# Patient Record
Sex: Female | Born: 1975
Health system: Southern US, Community
[De-identification: ages and names within clinical notes are randomized; demographics above are authoritative.]

## PROBLEM LIST (undated history)

## (undated) DIAGNOSIS — Z9889 Other specified postprocedural states: Secondary | ICD-10-CM

## (undated) DIAGNOSIS — E669 Obesity, unspecified: Secondary | ICD-10-CM

## (undated) DIAGNOSIS — E063 Autoimmune thyroiditis: Secondary | ICD-10-CM

## (undated) DIAGNOSIS — R112 Nausea with vomiting, unspecified: Secondary | ICD-10-CM

## (undated) DIAGNOSIS — J302 Other seasonal allergic rhinitis: Secondary | ICD-10-CM

## (undated) DIAGNOSIS — E039 Hypothyroidism, unspecified: Secondary | ICD-10-CM

## (undated) DIAGNOSIS — I2699 Other pulmonary embolism without acute cor pulmonale: Secondary | ICD-10-CM

## (undated) DIAGNOSIS — D649 Anemia, unspecified: Secondary | ICD-10-CM

## (undated) DIAGNOSIS — K5900 Constipation, unspecified: Secondary | ICD-10-CM

## (undated) HISTORY — DX: Constipation, unspecified: K59.00

## (undated) HISTORY — DX: Autoimmune thyroiditis: E06.3

## (undated) HISTORY — DX: Anemia, unspecified: D64.9

## (undated) HISTORY — PX: VAGINA RECONSTRUCTION SURGERY: SHX828

## (undated) HISTORY — DX: Hypothyroidism, unspecified: E03.9

---

## 2001-09-05 ENCOUNTER — Other Ambulatory Visit: Admission: RE | Admit: 2001-09-05 | Discharge: 2001-09-05 | Payer: Self-pay | Admitting: Obstetrics and Gynecology

## 2002-03-20 ENCOUNTER — Inpatient Hospital Stay (HOSPITAL_COMMUNITY): Admission: RE | Admit: 2002-03-20 | Discharge: 2002-03-23 | Payer: Self-pay | Admitting: Obstetrics and Gynecology

## 2002-09-26 ENCOUNTER — Other Ambulatory Visit: Admission: RE | Admit: 2002-09-26 | Discharge: 2002-09-26 | Payer: Self-pay | Admitting: Obstetrics and Gynecology

## 2003-02-23 ENCOUNTER — Emergency Department (HOSPITAL_COMMUNITY): Admission: EM | Admit: 2003-02-23 | Discharge: 2003-02-23 | Payer: Self-pay | Admitting: Emergency Medicine

## 2003-02-23 ENCOUNTER — Encounter: Payer: Self-pay | Admitting: Emergency Medicine

## 2003-04-07 ENCOUNTER — Ambulatory Visit (HOSPITAL_COMMUNITY): Admission: RE | Admit: 2003-04-07 | Discharge: 2003-04-07 | Payer: Self-pay | Admitting: Gastroenterology

## 2003-10-05 ENCOUNTER — Other Ambulatory Visit: Admission: RE | Admit: 2003-10-05 | Discharge: 2003-10-05 | Payer: Self-pay | Admitting: Obstetrics and Gynecology

## 2003-10-16 ENCOUNTER — Ambulatory Visit (HOSPITAL_COMMUNITY): Admission: RE | Admit: 2003-10-16 | Discharge: 2003-10-16 | Payer: Self-pay | Admitting: Obstetrics and Gynecology

## 2004-10-05 ENCOUNTER — Other Ambulatory Visit: Admission: RE | Admit: 2004-10-05 | Discharge: 2004-10-05 | Payer: Self-pay | Admitting: Obstetrics and Gynecology

## 2005-07-19 ENCOUNTER — Emergency Department (HOSPITAL_COMMUNITY): Admission: EM | Admit: 2005-07-19 | Discharge: 2005-07-19 | Payer: Self-pay | Admitting: Emergency Medicine

## 2005-10-10 ENCOUNTER — Other Ambulatory Visit: Admission: RE | Admit: 2005-10-10 | Discharge: 2005-10-10 | Payer: Self-pay | Admitting: Obstetrics and Gynecology

## 2008-07-05 ENCOUNTER — Emergency Department (HOSPITAL_BASED_OUTPATIENT_CLINIC_OR_DEPARTMENT_OTHER): Admission: EM | Admit: 2008-07-05 | Discharge: 2008-07-05 | Payer: Self-pay | Admitting: Emergency Medicine

## 2009-02-15 ENCOUNTER — Ambulatory Visit: Payer: Self-pay | Admitting: Family Medicine

## 2009-02-15 DIAGNOSIS — J020 Streptococcal pharyngitis: Secondary | ICD-10-CM

## 2009-02-15 LAB — CONVERTED CEMR LAB: Rapid Strep: POSITIVE

## 2009-08-05 ENCOUNTER — Observation Stay (HOSPITAL_COMMUNITY): Admission: AD | Admit: 2009-08-05 | Discharge: 2009-08-06 | Payer: Self-pay | Admitting: Obstetrics and Gynecology

## 2010-03-03 ENCOUNTER — Inpatient Hospital Stay (HOSPITAL_COMMUNITY): Admission: RE | Admit: 2010-03-03 | Discharge: 2010-03-06 | Payer: Self-pay | Admitting: Obstetrics and Gynecology

## 2010-08-25 LAB — CBC
HCT: 28.6 % — ABNORMAL LOW (ref 36.0–46.0)
HCT: 35.2 % — ABNORMAL LOW (ref 36.0–46.0)
Hemoglobin: 11.7 g/dL — ABNORMAL LOW (ref 12.0–15.0)
Hemoglobin: 9.6 g/dL — ABNORMAL LOW (ref 12.0–15.0)
MCH: 28.1 pg (ref 26.0–34.0)
MCH: 28.2 pg (ref 26.0–34.0)
MCHC: 33.3 g/dL (ref 30.0–36.0)
MCHC: 33.6 g/dL (ref 30.0–36.0)
MCV: 83.9 fL (ref 78.0–100.0)
MCV: 84.2 fL (ref 78.0–100.0)
Platelets: 169 10*3/uL (ref 150–400)
Platelets: 238 10*3/uL (ref 150–400)
RBC: 3.42 MIL/uL — ABNORMAL LOW (ref 3.87–5.11)
RBC: 4.18 MIL/uL (ref 3.87–5.11)
RDW: 16.2 % — ABNORMAL HIGH (ref 11.5–15.5)
RDW: 17.3 % — ABNORMAL HIGH (ref 11.5–15.5)
WBC: 11.1 10*3/uL — ABNORMAL HIGH (ref 4.0–10.5)
WBC: 9.6 10*3/uL (ref 4.0–10.5)

## 2010-08-25 LAB — RPR: RPR Ser Ql: NONREACTIVE

## 2010-08-25 LAB — SURGICAL PCR SCREEN
MRSA, PCR: NEGATIVE
Staphylococcus aureus: POSITIVE — AB

## 2010-08-31 LAB — CBC
HCT: 35.4 % — ABNORMAL LOW (ref 36.0–46.0)
HCT: 39.2 % (ref 36.0–46.0)
Hemoglobin: 11.7 g/dL — ABNORMAL LOW (ref 12.0–15.0)
Hemoglobin: 13 g/dL (ref 12.0–15.0)
MCHC: 33 g/dL (ref 30.0–36.0)
MCHC: 33.2 g/dL (ref 30.0–36.0)
MCV: 85.8 fL (ref 78.0–100.0)
MCV: 86.4 fL (ref 78.0–100.0)
Platelets: 262 10*3/uL (ref 150–400)
Platelets: 276 10*3/uL (ref 150–400)
RBC: 4.1 MIL/uL (ref 3.87–5.11)
RBC: 4.57 MIL/uL (ref 3.87–5.11)
RDW: 14.1 % (ref 11.5–15.5)
RDW: 14.6 % (ref 11.5–15.5)
WBC: 10.1 10*3/uL (ref 4.0–10.5)
WBC: 9.6 10*3/uL (ref 4.0–10.5)

## 2010-08-31 LAB — HCG, QUANTITATIVE, PREGNANCY: hCG, Beta Chain, Quant, S: 96210 m[IU]/mL — ABNORMAL HIGH (ref <5)

## 2010-08-31 LAB — URINALYSIS, ROUTINE W REFLEX MICROSCOPIC
Glucose, UA: NEGATIVE mg/dL
Ketones, ur: NEGATIVE mg/dL
Leukocytes, UA: NEGATIVE
Nitrite: POSITIVE — AB
Protein, ur: 30 mg/dL — AB
Specific Gravity, Urine: >1.03 — ABNORMAL HIGH (ref 1.005–1.030)
Urobilinogen, UA: 0.2 mg/dL (ref 0.0–1.0)
pH: 5.5 (ref 5.0–8.0)

## 2010-08-31 LAB — URINE CULTURE
Colony Count: NO GROWTH
Culture: NO GROWTH

## 2010-08-31 LAB — DIFFERENTIAL
Basophils Absolute: 0 10*3/uL (ref 0.0–0.1)
Basophils Relative: 0 % (ref 0–1)
Eosinophils Absolute: 0.1 10*3/uL (ref 0.0–0.7)
Eosinophils Relative: 1 % (ref 0–5)
Lymphocytes Relative: 24 % (ref 12–46)
Lymphs Abs: 2.3 10*3/uL (ref 0.7–4.0)
Monocytes Absolute: 0.9 10*3/uL (ref 0.1–1.0)
Monocytes Relative: 10 % (ref 3–12)
Neutro Abs: 6.3 10*3/uL (ref 1.7–7.7)
Neutrophils Relative %: 66 % (ref 43–77)

## 2010-08-31 LAB — URINE MICROSCOPIC-ADD ON

## 2010-08-31 LAB — STONE ANALYSIS: Stone Weight KSTONE: 0.009 g

## 2010-09-26 LAB — COMPREHENSIVE METABOLIC PANEL
ALT: 13 U/L (ref 0–35)
AST: 24 U/L (ref 0–37)
Albumin: 4.5 g/dL (ref 3.5–5.2)
Alkaline Phosphatase: 114 U/L (ref 39–117)
BUN: 12 mg/dL (ref 6–23)
CO2: 20 mEq/L (ref 19–32)
Calcium: 9.5 mg/dL (ref 8.4–10.5)
Chloride: 109 mEq/L (ref 96–112)
Creatinine, Ser: 0.6 mg/dL (ref 0.4–1.2)
GFR calc Af Amer: >60 mL/min (ref >60)
GFR calc non Af Amer: >60 mL/min (ref >60)
Glucose, Bld: 124 mg/dL — ABNORMAL HIGH (ref 70–99)
Potassium: 4 mEq/L (ref 3.5–5.1)
Sodium: 141 mEq/L (ref 135–145)
Total Bilirubin: 1.1 mg/dL (ref 0.3–1.2)
Total Protein: 8.1 g/dL (ref 6.0–8.3)

## 2010-09-26 LAB — URINALYSIS, ROUTINE W REFLEX MICROSCOPIC
Bilirubin Urine: NEGATIVE
Glucose, UA: NEGATIVE mg/dL
Ketones, ur: 15 mg/dL — AB
Leukocytes, UA: NEGATIVE
Nitrite: NEGATIVE
Protein, ur: NEGATIVE mg/dL
Specific Gravity, Urine: 1.028 (ref 1.005–1.030)
Urobilinogen, UA: 0.2 mg/dL (ref 0.0–1.0)
pH: 5.5 (ref 5.0–8.0)

## 2010-09-26 LAB — DIFFERENTIAL
Basophils Absolute: 0.2 10*3/uL — ABNORMAL HIGH (ref 0.0–0.1)
Basophils Relative: 1 % (ref 0–1)
Eosinophils Absolute: 0 10*3/uL (ref 0.0–0.7)
Eosinophils Relative: 0 % (ref 0–5)
Lymphocytes Relative: 3 % — ABNORMAL LOW (ref 12–46)
Lymphs Abs: 0.5 10*3/uL — ABNORMAL LOW (ref 0.7–4.0)
Monocytes Absolute: 0.9 10*3/uL (ref 0.1–1.0)
Monocytes Relative: 6 % (ref 3–12)
Neutro Abs: 14.2 10*3/uL — ABNORMAL HIGH (ref 1.7–7.7)
Neutrophils Relative %: 90 % — ABNORMAL HIGH (ref 43–77)

## 2010-09-26 LAB — CBC
HCT: 45.4 % (ref 36.0–46.0)
Hemoglobin: 14.8 g/dL (ref 12.0–15.0)
MCHC: 32.7 g/dL (ref 30.0–36.0)
MCV: 87.8 fL (ref 78.0–100.0)
Platelets: 323 10*3/uL (ref 150–400)
RBC: 5.16 MIL/uL — ABNORMAL HIGH (ref 3.87–5.11)
RDW: 13.8 % (ref 11.5–15.5)
WBC: 15.8 10*3/uL — ABNORMAL HIGH (ref 4.0–10.5)

## 2010-09-26 LAB — URINE MICROSCOPIC-ADD ON

## 2010-09-26 LAB — PREGNANCY, URINE: Preg Test, Ur: NEGATIVE

## 2010-09-26 LAB — LIPASE, BLOOD: Lipase: 28 U/L (ref 23–300)

## 2010-10-28 NOTE — H&P (Signed)
Krystal Kim, Krystal Kim                           ACCOUNT NO.:  [id number]   MEDICAL RECORD NO.:  [id number]                   PATIENT TYPE:  INP   LOCATION:  NA                                   [facility]  [facility]   PHYSICIAN:  Dois Davenport A. Rivard, M.D.              DATE OF BIRTH:  [birth year]   DATE OF ADMISSION:  03/20/2002  DATE OF DISCHARGE:                                HISTORY & PHYSICAL   HISTORY OF PRESENT ILLNESS:  The patient is a 35 year old married white  female gravida 2, para 1-0-0-1 at [redacted] weeks gestation admitted for elective  primary cesarean section secondary to history of severe vaginal trauma with  her first delivery.  She denies any nausea, vomiting, headaches, or visual  disturbances.  She denies any vaginal bleeding.  Her pregnancy has been  followed at [facility] M.D. service and has been essentially  uncomplicated, though at risk for history of severe vaginal trauma with the  first delivery.   OB/GYN HISTORY:  She is a gravida 2, para 1-0-0-1 who has an LMP of June 22, 2001 giving an [facility] of March 29, 2002 confirmed by ultrasound.  She  delivered a viable female infant in January 2000 who weighed 7 pounds 15  ounces at [redacted] weeks gestation following a 12 hour labor.  She delivered  vaginally, but had severe vaginal shredding and tearing.  She had to have  surgery after delivery to repair her vaginal laceration.  She had a history  of abnormal Pap and colposcopy in 1997.  Subsequently, her Paps have been  normal.   ALLERGIES:  CODEINE.  It gives her vomiting.   PAST MEDICAL HISTORY:  She reports having had the usual childhood diseases.  She reports a history of childhood asthma, none now, history of migraines in  the past.   PAST SURGICAL HISTORY:  After surgery for vaginal repair subsequent to  delivery in 2000 and wisdom teeth in 1999.   ANESTHESIA COMPLICATIONS:  She had vomiting subsequent to her wisdom teeth  surgery.   FAMILY HISTORY:   Significant for paternal grandfather with heart attack and  heart disease, maternal grandfather and paternal grandfather with chronic  hypertension, father with varicose veins, mother with anemia, maternal  grandfather and aunt with diabetes, maternal grandfather, maternal  grandmother, and great maternal grandmother with cancer.   GENETIC HISTORY:  Essentially negative.   SOCIAL HISTORY:  She is married to [employer] who is involved and  supportive.  They are both employed full-time.  They have the [place]  faith.  They deny any illicit drug use, alcohol, or smoking with this  pregnancy.   PRENATAL LABORATORIES:  Her blood type is O+.  Her antibody screen is  negative.  Syphilis is nonreactive.  Rubella is immune.  Hepatitis B surface  antigen is negative.  GC and Chlamydia both negative.  Pap was within normal  limits.  Her one hour Glucola was 95 and her 36 week beta Strep was  negative.   PHYSICAL EXAMINATION:  VITAL SIGNS:  Stable.  She is afebrile.  HEENT:  Grossly within normal limits.  HEART:  Regular rhythm and rate.  CHEST:  Clear.  BREASTS:  Soft and nontender.  ABDOMEN:  Gravid without regular uterine contractions.  Her fetal heart rate  is reassuring.  PELVIC:  Her pelvic examination was deferred.  EXTREMITIES:  Within normal limits.   ASSESSMENT:  1. Intrauterine pregnancy at term.  2. Previous vaginal delivery with severe vaginal trauma.   PLAN:  Admit to [facility] for elective primary cesarean section per  Dr. Dois Davenport Rivard.     Concha Pyo. Duplantis, C.N.M.              Crist Fat Rivard, M.D.    SJD/MEDQ  D:  03/20/2002  T:  03/20/2002  Job:  [id number]

## 2010-10-28 NOTE — Op Note (Signed)
NAMEMARSHEILA, Krystal Kim                           ACCOUNT NO.:  [id number]   MEDICAL RECORD NO.:  [id number]                   PATIENT TYPE:  AMB   LOCATION:  SDC                                  [facility]  [facility]   PHYSICIAN:  Dois Davenport A. Rivard, M.D.              DATE OF BIRTH:  [birth year]   DATE OF PROCEDURE:  10/16/2003  DATE OF DISCHARGE:                                 OPERATIVE REPORT   PREOPERATIVE DIAGNOSIS:  Dyspareunia with perineal band.   POSTOPERATIVE DIAGNOSIS:  Dyspareunia with perineal band.   ANESTHESIA:  General, Raul Del, M.D.   PROCEDURE:  Perineoplasty.   SURGEON:  Crist Fat. Rivard, M.D.   ESTIMATED BLOOD LOSS:  Minimal.   PROCEDURE:  After being informed of the planned procedure with risks and  benefits, informed consent was obtained.  The patient is taken to OR #2,  given general anesthesia with laryngeal mask, and placed in a lithotomy  position.  She is prepped and draped in a sterile fashion.  We identify  easily the perineal band, which is incised transversely on a 3 cm distance  after infiltrating the area with 10 mL of Marcaine 0.25%.  The vaginal  mucosa is then undermined with Metzenbaum scissors, scar tissue is broken,  and the posterior vaginal mucosa is now free to be pulled to the perineal  edge.  Some bleeding in the deep tissue is controlled with two figure-of-  eight stitches of 3-0 Vicryl.  We can now paradoxically close this incision,  bringing the vaginal mucosa edge to the perineal edge with simple sutures of  3-0 Monocryl. One stitch is placed on the perineal muscle with 3-0 Vicryl  and perineal skin is closed with interrupted sutures of 3-0 Monocryl.  Hemostasis is adequate.  At the end of the procedure there is absolutely no  perineal band and still a normal-size vaginal opening.   Instrument and sponge count is complete x2.  Estimated blood loss is  minimal.  The procedure is very well-tolerated by the patient, who is taken  to  the recovery room in a well and stable condition.                                               Crist Fat Rivard, M.D.    SAR/MEDQ  D:  10/16/2003  T:  10/17/2003  Job:  [id number]

## 2010-10-28 NOTE — Discharge Summary (Signed)
   Krystal Kim, Krystal Kim                           ACCOUNT NO.:  [id number]   MEDICAL RECORD NO.:  [id number]                   PATIENT TYPE:  INP   LOCATION:  9122                                 [facility]ILITY:  [facility]   PHYSICIAN:  Hal Morales, M.D.             DATE OF BIRTH:  01-12-1976   DATE OF ADMISSION:  03/20/2002  DATE OF DISCHARGE:  03/23/2002                                 DISCHARGE SUMMARY   ADMISSION DIAGNOSES:  Intrauterine pregnancy at term.   PLAN:  1. Primary elective cesarean section.  2. Previous vaginal delivery with severe vaginal trauma.   PROCEDURE:  Primary low transverse cesarean section.   DISCHARGE DIAGNOSES:  1. Intrauterine pregnancy at term.  2. Primary elective low transverse cesarean section.  3. Previous vaginal delivery with severe vaginal trauma.   HISTORY:  The patient is a 35 year old gravida 2, para 1-0-0-1 who presents  at 38-1/2 weeks for primary elective cesarean section secondary to perineal  trauma with her first delivery.  She underwent primary low transverse  cesarean section on 03/20/02 by Dr. Dois Davenport Rivard with the birth of a 7  pound 6 ounce female infant with Apgar scores of 8 at one minute and 9 at  five minutes.  Both patient and baby have done well in the immediate  postpartum postoperative period.  Hemoglobin on the first postoperative day  was 10.7.  The patient's vital signs have been stable and on her third  postoperative day she was judge to be in satisfactory condition for  discharge.   DISCHARGE INSTRUCTIONS:  [facility].   DISCHARGE MEDICATIONS:  1. Motrin 600 mg p.o. q.6h. p.r.n. pain.  2. Tylox one to two p.o. q.3-4h. p.r.n. pain.  3. Ortho-Evra patch for contraception.  4. Prenatal vitamins.   FOLLOW UP:  This patient will follow up at [facility] in six  week.     Rica Koyanagi, C.N.M.               Hal Morales, M.D.    SDM/MEDQ  D:  03/23/2002  T:  03/24/2002   Job:  [id number]

## 2010-10-28 NOTE — Op Note (Signed)
NAMEHELI, DINO                           ACCOUNT NO.:  [id number]   MEDICAL RECORD NO.:  [id number]                   PATIENT TYPE:  INP   LOCATION:  NA                                   [facility]   PHYSICIAN:  Dois Davenport A. Rivard, M.D.              DATE OF BIRTH:  1975/10/12   DATE OF PROCEDURE:  03/20/2002  DATE OF DISCHARGE:                                 OPERATIVE REPORT   PREOPERATIVE DIAGNOSES:  1. Intrauterine pregnancy at 38+2 weeks.  2. History of perineal trauma with first pregnancy for elective cesarean     section.   POSTOPERATIVE DIAGNOSES:  1. Intrauterine pregnancy at 38+2 weeks.  2. History of perineal trauma with first pregnancy for elective cesarean     section.   ANESTHESIA:  Spinal.   PROCEDURE:  Primary low transverse cesarean section.   SURGEON:  Crist Fat. Rivard, M.D.   ASSISTANT:  Concha Pyo. Duplantis, C.N.M.   ESTIMATED BLOOD LOSS:  500 cc.   PROCEDURE:  After being informed of the planned procedure with possible  complications including bleeding, infection, injury to bowels, bladder, or  ureters, informed consent was obtained.  The patient was taken to OR number  one and given spinal anesthesia.  She was placed in a dorsal decubitus  position pelvis tilted to the left, prepped and draped in a sterile fashion,  and the Foley catheter was inserted in her bladder.   After assessing adequate level of anesthesia, the suprapubic skin area was  infiltrated with Marcaine 0.25 10 cc and we proceeded with a Pfannenstiel  incision which was brought down bluntly to the fascia.  The fascia was then  incised in a low transverse fashion.  Linea alba was dissected and  peritoneum was entered in the midline fashion.  Visceroperitoneum was  entered in a low transverse fashion allowing [place] to develop a bladder flap  and safely retract bladder.  We were able to proceed with low transverse  incision on the myometrium.  Incision was extended bluntly and we  assisted  the birth of a female infant in ROA presentation at 1:39 p.m.  Mouth and  nose were suctioned.  Baby was delivered.  Cord was clamped with two Kelly  clamps and sectioned and the baby was given to Dr. Mikle Bosworth present in the  room.  20 cc of blood was drawn through the umbilical vein and the placenta  was allowed to deliver spontaneously.  The patient received a dose of Ancef  1 g at the cord clamping.   Uterine revision is negative.  We proceed with closure of the hysterotomy in  two planes, first with a running locked suture of 0 Vicryl, then with a  Lambert suture of 0 Vicryl covering the first one.  Hemostasis is completed  in the right angle with a figure-of-eight stitch of 0 Vicryl.  Hemostasis is  also completed with  cautery on the peritoneal edges.  Both tubes and ovaries  are assessed and adequate.  We irrigate with warm saline and note an  adequate hemostasis.  On the fascia hemostasis was completed with cautery  and the fascia was then closed with two running sutures of 0 Vicryl meeting  midline.  Fat was irrigated with warm saline.  Hemostasis was completed with  cautery and skin was closed with staples.   Baby received an Apgar 8 at one minute, 9 at five minutes and weighed 7  pounds 6 ounces.  Estimated blood loss is 500 cc.  Instruments and sponge  count is complete x2.  The procedure was very well tolerated by the patient  who is taken to the recovery room in a well and stable condition.                                               Crist Fat Rivard, M.D.    SAR/MEDQ  D:  03/20/2002  T:  03/20/2002  Job:  [id number]

## 2011-04-21 ENCOUNTER — Encounter: Payer: Self-pay | Admitting: Family Medicine

## 2011-04-21 ENCOUNTER — Ambulatory Visit (INDEPENDENT_AMBULATORY_CARE_PROVIDER_SITE_OTHER): Payer: 59 | Admitting: Family Medicine

## 2011-04-21 DIAGNOSIS — Z862 Personal history of diseases of the blood and blood-forming organs and certain disorders involving the immune mechanism: Secondary | ICD-10-CM

## 2011-04-21 DIAGNOSIS — J329 Chronic sinusitis, unspecified: Secondary | ICD-10-CM

## 2011-04-21 DIAGNOSIS — Z833 Family history of diabetes mellitus: Secondary | ICD-10-CM

## 2011-04-21 DIAGNOSIS — R0982 Postnasal drip: Secondary | ICD-10-CM | POA: Insufficient documentation

## 2011-04-21 DIAGNOSIS — E669 Obesity, unspecified: Secondary | ICD-10-CM

## 2011-04-21 NOTE — Assessment & Plan Note (Signed)
Patient's coughing likely due to postnasal drainage. No evidence of reflux or bronchitis.  Will treat empirically with daily Zyrtec and nasal saline rinse. I advised the patient to give me a call if this does not help after a two-week trial and will prescribe Flonase.

## 2011-04-21 NOTE — Assessment & Plan Note (Signed)
We discussed her BMI and classification of obesity. We discussed possible further resources to help with therapeutic lifestyle management. Advised returning for fasting labs given her risk factors and family history of diabetes

## 2011-04-21 NOTE — Patient Instructions (Signed)
Nice to meet you  Make appointment for fasting labwork- no eating or drinking except water for 6-8 hours  We will check your cholesterol and fasting glucose (diabetes)  Annual follow-up  Postnasal drainage: see info on nasal sailne rinse, take zyrtec daily.  Let me know if you don't see any improvement in 2 weeks

## 2011-04-21 NOTE — Progress Notes (Signed)
  Subjective:    Patient ID: Krystal Kim, female    DOB: [birth year], 35 y.o.   MRN: [id number] HPI New patient here to establish primary care and discuss nasal congestion.  Patient has been receiving care only from her OB/GYN at [facility]  Nasal congestion: For the past two months- has had cough with postnasal drainage..  Worse at night, a great deal of mucous in the morning and late afternoon. She denies any fevers, chills, nausea, vomiting, abdominal pain. She has had no itching or sneezing. She denies any epigastric burning or sour taste.  She has been Taking sudafed with minimal relief.  She is exposed to passive smoke from her husband. No new allergens such as pets or environmental factors. No history of seasonal allergies.   Review of Systems General:  Negative for fever, chills, malaise, myalgias HEENT: Negative for conjunctivitis, ear pain or drainage, rhinorrhea,   Respiratory:  Negative for  dyspnea Abdomen: Negative for abdominal pain, emesis, diarrhea Skin:  Negative for rash        Objective:   Physical Exam GEN: Alert & Oriented, No acute distress HEENT: [place]/AT. EOMI, PERRLA, no conjunctival injection or scleral icterus.  Bilateral tympanic membranes intact without erythema or effusion.  .  Nares without edema or rhinorrhea.  Oropharynx is without erythema or exudates.  No anterior or posterior cervical lymphadenopathy. CV:  Regular Rate & Rhythm, no murmur Respiratory:  Normal work of breathing, CTAB Abd:  + BS, soft, no tenderness to palpation Ext: no pre-tibial edema        Assessment & Plan:

## 2011-12-07 ENCOUNTER — Ambulatory Visit (INDEPENDENT_AMBULATORY_CARE_PROVIDER_SITE_OTHER): Payer: 59 | Admitting: Family Medicine

## 2011-12-07 ENCOUNTER — Encounter: Payer: Self-pay | Admitting: Family Medicine

## 2011-12-07 VITALS — BP 124/80 | HR 105 | Temp 98.5°F | Ht 63.0 in | Wt 231.2 lb

## 2011-12-07 DIAGNOSIS — R058 Other specified cough: Secondary | ICD-10-CM | POA: Insufficient documentation

## 2011-12-07 DIAGNOSIS — R05 Cough: Secondary | ICD-10-CM | POA: Insufficient documentation

## 2011-12-07 DIAGNOSIS — R059 Cough, unspecified: Secondary | ICD-10-CM

## 2011-12-07 MED ORDER — BENZONATATE 100 MG PO CAPS: 100.0000 mg | ORAL_CAPSULE | Freq: Three times a day (TID) | ORAL | Status: AC | PRN

## 2011-12-07 NOTE — Assessment & Plan Note (Signed)
Discussed expected course, will resolve on its own.  Discussed options for treatment.  She declines narcotic based cough syrups.  Will rx tessalon, discussed to return for evaluation of chronic cough is persist 6-8 weeks past cold.

## 2011-12-07 NOTE — Patient Instructions (Addendum)
Let me know if you cough persists  We are happy to schedule you a lab visit for fating labwork (diabetes, cholesterol)

## 2011-12-07 NOTE — Progress Notes (Signed)
  Subjective:    Patient ID: Krystal Kim, female    DOB: [birth year], 36 y.o.   MRN: [id number]  HPIwork in for cough  Cough x 3 weeks.  Has a previous cough int he spring due to postnasal drainage, that had resolved with taking zyrtec.  Started with a mild cold, now resolved.  No more rhinorrhea.  Coughing worst at night.  No fever, chills, dyspnea.  Feels well.  Not taking any OTC meds.  I have reviewed patient's  PMH, FH, and Social history and Medications as related to this visit. Review of Systems See HPI    Objective:   Physical Exam GEN: Alert & Oriented, No acute distress HEENT: [place]/AT. EOMI, PERRLA, no conjunctival injection or scleral icterus.  Bilateral tympanic membranes intact without erythema or effusion.  .  Nares without edema or rhinorrhea.  Oropharynx is without erythema or exudates.  No anterior or posterior cervical lymphadenopathy. CV:  Regular Rate & Rhythm, no murmur Respiratory:  Normal work of breathing, CTAB         Assessment & Plan:

## 2012-01-13 ENCOUNTER — Encounter (HOSPITAL_COMMUNITY): Payer: Self-pay | Admitting: *Deleted

## 2012-01-13 ENCOUNTER — Emergency Department (HOSPITAL_COMMUNITY)
Admission: EM | Admit: 2012-01-13 | Discharge: 2012-01-13 | Disposition: A | Discharge status: Home / Self Care | Payer: 59 | Source: Home / Self Care | Attending: Emergency Medicine | Admitting: Emergency Medicine

## 2012-01-13 DIAGNOSIS — H9209 Otalgia, unspecified ear: Secondary | ICD-10-CM

## 2012-01-13 DIAGNOSIS — H9202 Otalgia, left ear: Secondary | ICD-10-CM

## 2012-01-13 DIAGNOSIS — J029 Acute pharyngitis, unspecified: Secondary | ICD-10-CM

## 2012-01-13 HISTORY — DX: Other seasonal allergic rhinitis: J30.2

## 2012-01-13 HISTORY — DX: Obesity, unspecified: E66.9

## 2012-01-13 LAB — POCT RAPID STREP A: Streptococcus, Group A Screen (Direct): NEGATIVE

## 2012-01-13 MED ORDER — IBUPROFEN 600 MG PO TABS: 600.0000 mg | ORAL_TABLET | Freq: Four times a day (QID) | ORAL | Status: AC | PRN

## 2012-01-13 MED ORDER — LIDOCAINE VISCOUS 2 % MT SOLN: 10.0000 mL | Freq: Three times a day (TID) | OROMUCOSAL | Status: AC | PRN

## 2012-01-13 MED ORDER — GI COCKTAIL ~~LOC~~
ORAL | Status: AC
  Filled 2012-01-13: qty 30

## 2012-01-13 MED ORDER — GI COCKTAIL ~~LOC~~: 30.0000 mL | Freq: Once | ORAL | Status: DC

## 2012-01-13 NOTE — ED Notes (Signed)
Pt is here with complaints of left sided sore throat and ear pain, onset this am.  Denies any additional symptoms.

## 2012-01-13 NOTE — ED Provider Notes (Addendum)
History     CSN: [id number]  Arrival date & time 01/13/12  1634   First MD Initiated Contact with Patient 01/13/12 1718      Chief Complaint  Patient presents with  . Sore Throat  . Otalgia    (Consider location/radiation/quality/duration/timing/severity/associated sxs/prior treatment) HPI Comments: Patient reports sharp pain in her left throat, which radiates up to her left ear starting today. No nausea, vomiting, fevers. No nasal congestion, postnasal drip. No otorrhea or change in hearing. No voice changes, drooling, trismus, sensation of throat swelling shut. No coughing, wheezing. No reflux symptoms. She has a history of seasonal allergies for which she takes Zyrtec. She is not a smoker.   Patient is a 36 y.o. female presenting with pharyngitis and ear pain. The history is provided by the patient. No language interpreter was used.  Sore Throat This is a new problem. The current episode started 12 to 24 hours ago. The problem occurs constantly. The problem has been gradually worsening. The symptoms are aggravated by swallowing. She has tried nothing for the symptoms. The treatment provided no relief.  Otalgia    Past Medical History  Diagnosis Date  . Anemia   . Seasonal allergies   . Obesity     Past Surgical History  Procedure Date  . Cesarean section     Family History  Problem Relation Age of Onset  . Diabetes Father   . Hypertension Son   . Diabetes Maternal Aunt   . Heart disease Maternal Grandmother     History  Substance Use Topics  . Smoking status: Former Smoker    Quit date: 06/20/1997  . Smokeless tobacco: Not on file  . Alcohol Use: No    OB History    Grav Para Term Preterm Abortions TAB SAB Ect Mult Living                  Review of Systems  HENT: Positive for ear pain.     Allergies  Codeine  Home Medications   Current Outpatient Rx  Name Route Sig Dispense Refill  . CETIRIZINE HCL 10 MG PO TABS Oral Take 10 mg by mouth daily.     . IBUPROFEN 600 MG PO TABS Oral Take 1 tablet (600 mg total) by mouth every 6 (six) hours as needed for pain. 30 tablet 0  . LIDOCAINE VISCOUS 2 % MT SOLN Oral Take 10 mLs by mouth 3 (three) times daily as needed for pain. Swish and spit. Do not swallow. 100 mL 0    BP 145/94  Pulse 90  Temp 98.6 F (37 C) (Oral)  Resp 20  SpO2 99%  LMP 12/21/2011  Physical Exam  Nursing note and vitals reviewed. Constitutional: She appears well-developed and well-nourished.  HENT:  Head: Normocephalic and atraumatic.  Right Ear: Tympanic membrane and ear canal normal.  Left Ear: Tympanic membrane and ear canal normal.  Nose: Nose normal. No epistaxis.  Mouth/Throat: Uvula is midline and mucous membranes are normal. Posterior oropharyngeal erythema present. No oropharyngeal exudate.       Enlarged, erythematous tonsils. No exudates (-) frontal, maxillary sinus tenderness  Eyes: Conjunctivae and EOM are normal.  Neck: Normal range of motion. Neck supple.  Cardiovascular: Normal rate.   Pulmonary/Chest: Effort normal and breath sounds normal.  Abdominal: She exhibits no distension.  Musculoskeletal: Normal range of motion.  Lymphadenopathy:    She has no cervical adenopathy.  Neurological: She is alert. Coordination normal.  Skin: Skin is warm and dry.  No rash noted.  Psychiatric: She has a normal mood and affect. Her behavior is normal. Judgment and thought content normal.    ED Course  Procedures (including critical care time)   Labs Reviewed  POCT RAPID STREP A ([facility] ONLY)   No results found.   1. Pharyngitis   2. Otalgia of left ear     Results for orders placed during the hospital encounter of 01/13/12  POCT RAPID STREP A ([facility] ONLY)      Component Value Range   Streptococcus, Group A Screen (Direct) NEGATIVE  NEGATIVE     MDM   Rapid strep negative. No evidence of OM. Treating as viral pharyngitis. Also discussed possibility that this could be reflux.  Discussed this with patient.  Patient home with ibuprofen, Tylenol, viscous lidocaine. Patient to followup with PMD when necessary   Luiz Blare, MD 01/13/12 2003  Luiz Blare, MD 01/13/12 2004

## 2012-02-16 ENCOUNTER — Ambulatory Visit: Payer: 59 | Admitting: Obstetrics and Gynecology

## 2012-03-01 ENCOUNTER — Ambulatory Visit (INDEPENDENT_AMBULATORY_CARE_PROVIDER_SITE_OTHER): Payer: 59 | Admitting: Family Medicine

## 2012-03-01 ENCOUNTER — Encounter: Payer: Self-pay | Admitting: Family Medicine

## 2012-03-01 ENCOUNTER — Telehealth: Payer: Self-pay | Admitting: Family Medicine

## 2012-03-01 VITALS — BP 113/78 | HR 116 | Temp 98.7°F | Ht 63.0 in | Wt 226.0 lb

## 2012-03-01 DIAGNOSIS — J40 Bronchitis, not specified as acute or chronic: Secondary | ICD-10-CM

## 2012-03-01 MED ORDER — ALBUTEROL SULFATE HFA 108 (90 BASE) MCG/ACT IN AERS: 2.0000 | INHALATION_SPRAY | Freq: Four times a day (QID) | RESPIRATORY_TRACT | Status: DC | PRN

## 2012-03-01 MED ORDER — BENZONATATE 100 MG PO CAPS: 100.0000 mg | ORAL_CAPSULE | Freq: Two times a day (BID) | ORAL | Status: DC | PRN

## 2012-03-01 MED ORDER — AMOXICILLIN 500 MG PO CAPS: 500.0000 mg | ORAL_CAPSULE | Freq: Two times a day (BID) | ORAL | Status: DC

## 2012-03-01 NOTE — Progress Notes (Signed)
  Subjective:    Patient ID: Krystal Kim, female    DOB: 1976-05-01, 36 y.o.   MRN: [id number]  HPI  Krystal Kim comes in with a cough and nasal congestion that have been getting worse.  Symptoms have been present since labor day.  She initially had some low grade fevers and felt achy all over, but that has resolved.  Now she has some mild shortness of breath, and frequent cough, coughing up yellow sputum.  She also has persistent nasal congestion and some headaches.  The coughing has made it hard to work.  She is a former smoker, and has never had asthma or pneumonia.  She says she had bronchitis once before.   Review of Systems See HPI    Objective:   Physical Exam BP 113/78  Pulse 116  Temp 98.7 F (37.1 C) (Oral)  Ht 5\' 3"  (1.6 m)  Wt 226 lb (102.513 kg)  BMI 40.03 kg/m2  SpO2 95%  LMP 02/11/2012 General appearance: alert, cooperative and frequent coughing Ears: normal TM's and external ear canals both ears Nose: turbinates red, swollen Throat: lips, mucosa, and tongue normal; teeth and gums normal Neck: no adenopathy and supple, symmetrical, trachea midline Lungs: End expiratory wheezes throughout lungs, good air movement.  Heart: regular rate and rhythm, S1, S2 normal, no murmur, click, rub or gallop       Assessment & Plan:

## 2012-03-01 NOTE — Patient Instructions (Signed)

## 2012-03-01 NOTE — Assessment & Plan Note (Addendum)
History and exam consistent with bronchitis, will treat with amoxicillin and albuterol, and tessalon pearls as needed for cough (Pt allergic to codeine).  F/U if not improving in a week.

## 2012-03-01 NOTE — Telephone Encounter (Signed)
Told patient to be here at 1:30 and we would work her in.

## 2012-03-01 NOTE — Telephone Encounter (Signed)
Pt is requesting wi appt, has continued cold symptoms, pt works on [employer] and is flexible on what time.

## 2012-03-15 ENCOUNTER — Telehealth: Payer: Self-pay | Admitting: Family Medicine

## 2012-03-15 NOTE — Telephone Encounter (Signed)
Returned call to patient.  Patient saw Dr. Lula Olszewski on 03/01/12 and was Rx'd amoxicillin for bronchitis.  Finished abx and continues to have productive cough with "thick dark yellow" sputum.  C/o chest pain with coughing and when she takes deep breath.  Has "head congestion", but denies fever.  Sudafed is not helping with symptoms.  Patient informed she will need office visit to be re-evaluated.  No appts available today and informed patient to go to urgent care for evaluation.  Patient agreeable.   Gaylene Brooks, RN

## 2012-03-15 NOTE — Telephone Encounter (Signed)
Pt is not any better with cough/congestion - finished abx and still feels bad.  No fever.  Wants to talk to nurse

## 2012-03-28 ENCOUNTER — Ambulatory Visit (INDEPENDENT_AMBULATORY_CARE_PROVIDER_SITE_OTHER): Payer: 59 | Admitting: Family Medicine

## 2012-03-28 ENCOUNTER — Encounter: Payer: Self-pay | Admitting: Family Medicine

## 2012-03-28 VITALS — BP 121/87 | HR 86 | Temp 98.0°F | Ht 63.0 in | Wt 227.7 lb

## 2012-03-28 DIAGNOSIS — R05 Cough: Secondary | ICD-10-CM | POA: Insufficient documentation

## 2012-03-28 MED ORDER — GUAIFENESIN-DM 100-10 MG/5ML PO SYRP: 5.0000 mL | ORAL_SOLUTION | Freq: Every evening | ORAL | Status: DC | PRN

## 2012-03-28 MED ORDER — AMOXICILLIN-POT CLAVULANATE 875-125 MG PO TABS: 1.0000 | ORAL_TABLET | Freq: Two times a day (BID) | ORAL | Status: DC

## 2012-03-28 NOTE — Progress Notes (Signed)
  Subjective:    Patient ID: Krystal Kim, female    DOB: Mar 25, 1976, 36 y.o.   MRN: [id number]  HPI # Work-in: persistent cold symptoms despite course of antibiotics  Her symptoms began September 1.  She was seen in clinic 09/20, diagnosed with bronchitis, and given course of amoxicillin. Her chest pain improved after antibiotics, but she continued to have thick yellow sputum and nasal congestion that has not improved since then. She has had no more fevers after taking the antibiotics.   Tessalon does not help with the cough.   Denies sick contacts.   Review of Systems Per HPI Denise nausea. Normal appetite.  Denies difficulty breathing.   Allergies, medication, past medical history reviewed.     Objective:   Physical Exam GEN: appears uncomfortable HEENT:   Head: moderate tenderness maxillary sinuses bilaterally   Eyes: normal conjunctiva without injection or tearing   Ears: TM clear bilaterally with good light reflex and without erythema or air-fluid level   Nose: nasal congestion with clear nasal discharge   Mouth: MMM; no tonsillar adenopathy; no oropharyngeal erythema NECK: no LAD PULM: NI WOB; CTAB without w/r/r    Assessment & Plan:

## 2012-03-28 NOTE — Patient Instructions (Addendum)
Try a stronger antibiotic (Augmentin) twice a day for 7 days  Try cough medicine at nighttime (you may use during day but it may make you sleepy)  Drink plenty of fluids  Follow-up end of next week if you are not feeling better or sooner if you have worsening headache or fever

## 2012-03-28 NOTE — Assessment & Plan Note (Addendum)
Her symptoms have been persistent since September 1. She was diagnosed with bronchitis and took a course of amoxicillin, however, her symptoms are relatively persistent. She presents with moderate maxillary sinus tenderness today and appears uncomfortable with several episodes of coughing and significant nasal congestion without fever. We will try course of Augmentin for sinusitis and for potential amoxicillin-failure. She was advised to return in a week if her symptoms persist. She has a history of being seen for post-nasal drip and post-viral cough syndrome. It may be the same, however, I will treat due to the duration of her symptoms and symptoms that may indicate sinusitis.

## 2012-04-17 ENCOUNTER — Ambulatory Visit: Payer: 59 | Admitting: Obstetrics and Gynecology

## 2013-05-12 ENCOUNTER — Ambulatory Visit (INDEPENDENT_AMBULATORY_CARE_PROVIDER_SITE_OTHER): Payer: 59 | Admitting: Family Medicine

## 2013-05-12 VITALS — BP 153/89 | HR 103 | Temp 98.1°F | Wt 247.0 lb

## 2013-05-12 DIAGNOSIS — H00016 Hordeolum externum left eye, unspecified eyelid: Secondary | ICD-10-CM

## 2013-05-12 DIAGNOSIS — H00019 Hordeolum externum unspecified eye, unspecified eyelid: Secondary | ICD-10-CM | POA: Insufficient documentation

## 2013-05-12 NOTE — Assessment & Plan Note (Addendum)
With no fevers, only mild erythema and tenderness, and no visual/extraocular movement changes or conjunctivitis, likely a stye.  - Continue warm compresses. - If no improvement in 3-5 days, call the office and we can call in PO keflex. - However, if any new symptoms develop (fevers, chills, vision changes, ophthalmoplagia, conjunctivitis) or symptoms significantly worsen, come back for re-evaluation.

## 2013-05-12 NOTE — Patient Instructions (Signed)
This looks like a stye.  I have printed information on styes. Continue with warm compresses. If in another 3-5 days you are no better or develop fevers, vision changes, or other concerning symptoms, please call. We can prescribe antibiotics if no better. If much worse, we will need to re-evaluate.   Leona Singleton, MD

## 2013-05-12 NOTE — Progress Notes (Signed)
Patient ID: Krystal Kim, female   DOB: [birth year], 37 y.o.   MRN: [id number] Subjective:   CC: Swollen eye  HPI:   1. Swollen eye - Pt reports 4-5 days of red swollen eye that has become increasingly red, painful, and swollen. She has had two days of mild yellow discharge that was thicker this morning. She denies fevers, chills, cough, sneezing, sore throat, vision changes, or sick contacts. She has h/o bronchitis and had a few 'eye infections' 10+ years ago for which she used topical antibiotics. She has been using warm compresses to no avail for the past 4 days and has stopped using eye makeup.   Review of Systems - Per HPI.   PMH: Medication reviewed - she is not currently taking albuterol or zyrtec. Old medications removed from list (augmentin, tessalon, robitussin DM). OTC prenatal with folic acid - trying to get preg.  Objective:  Physical Exam There were no vitals taken for this visit. GEN: NAD, pleasant, well-groomed and well-nourished PULM: Normal effort HEENT: Sclera clear, left eyelid with mildly swollen erythematous lesion mid-upper eyelid that is nondraining, PERRL, EOMI PSYCH: Mood and affect euthymic NEURO: Awake, alert, no focal deficit, EOMI, normal speech  Assessment:     Krystal Kim is a 37 y.o. female here for swollen left eyelid.    Plan:     # See problem list and after visit summary for problem-specific plans.  # Health Maintenance: Follow up when convenient to meet your new PCP Dr Lum Babe.  Follow-up: Follow up in 3-5 days if worsened.   Leona Singleton, MD [facility]

## 2013-06-09 LAB — OB RESULTS CONSOLE ANTIBODY SCREEN: ANTIBODY SCREEN: NEGATIVE

## 2013-06-09 LAB — OB RESULTS CONSOLE ABO/RH: RH Type: POSITIVE

## 2013-06-09 LAB — OB RESULTS CONSOLE RUBELLA ANTIBODY, IGM: Rubella: IMMUNE

## 2013-06-09 LAB — OB RESULTS CONSOLE HEPATITIS B SURFACE ANTIGEN: Hepatitis B Surface Ag: NEGATIVE

## 2013-06-09 LAB — OB RESULTS CONSOLE RPR: RPR: NONREACTIVE

## 2013-06-09 LAB — OB RESULTS CONSOLE HIV ANTIBODY (ROUTINE TESTING): HIV: NONREACTIVE

## 2013-12-03 ENCOUNTER — Other Ambulatory Visit: Payer: Self-pay | Admitting: Obstetrics and Gynecology

## 2014-01-05 ENCOUNTER — Encounter (HOSPITAL_COMMUNITY): Payer: Self-pay | Admitting: Pharmacist

## 2014-01-16 ENCOUNTER — Encounter (HOSPITAL_COMMUNITY): Payer: Self-pay

## 2014-01-16 ENCOUNTER — Encounter (HOSPITAL_COMMUNITY)
Admission: RE | Admit: 2014-01-16 | Discharge: 2014-01-16 | Disposition: A | Discharge status: Home / Self Care | Payer: 59 | Source: Ambulatory Visit | Attending: Obstetrics and Gynecology | Admitting: Obstetrics and Gynecology

## 2014-01-16 DIAGNOSIS — E669 Obesity, unspecified: Secondary | ICD-10-CM | POA: Insufficient documentation

## 2014-01-16 DIAGNOSIS — K219 Gastro-esophageal reflux disease without esophagitis: Secondary | ICD-10-CM

## 2014-01-16 DIAGNOSIS — Z01812 Encounter for preprocedural laboratory examination: Secondary | ICD-10-CM

## 2014-01-16 HISTORY — DX: Other specified postprocedural states: Z98.890

## 2014-01-16 HISTORY — DX: Nausea with vomiting, unspecified: R11.2

## 2014-01-16 LAB — CBC
HCT: 32.5 % — ABNORMAL LOW (ref 36.0–46.0)
Hemoglobin: 9.8 g/dL — ABNORMAL LOW (ref 12.0–15.0)
MCH: 23.5 pg — AB (ref 26.0–34.0)
MCHC: 30.2 g/dL (ref 30.0–36.0)
MCV: 77.9 fL — ABNORMAL LOW (ref 78.0–100.0)
Platelets: 268 10*3/uL (ref 150–400)
RBC: 4.17 MIL/uL (ref 3.87–5.11)
RDW: 17.2 % — ABNORMAL HIGH (ref 11.5–15.5)
WBC: 10.2 10*3/uL (ref 4.0–10.5)

## 2014-01-16 LAB — ABO/RH: ABO/RH(D): O POS

## 2014-01-16 LAB — RPR

## 2014-01-16 NOTE — Patient Instructions (Signed)
20 Krystal Kim  01/16/2014   Your procedure is scheduled on:  01/19/14  Enter through the Main Entrance of [facility] at 1145 AM.  Pick up the phone at the desk and dial [phone].   Call this number if you have problems the morning of surgery: [phone]   Remember:   Do not eat food:after midnight   Do not drink clear liquids: 4 Hours before arrival.  Take these medicines the morning of surgery with A SIP OF WATER: Protonix   Do not wear jewelry, make-up or nail polish.  Do not wear lotions, powders, or perfumes. You may wear deodorant.  Do not shave 48 hours prior to surgery.  Do not bring valuables to the hospital.  [facility] is not   responsible for any belongings or valuables brought to the hospital.  Contacts, dentures or bridgework may not be worn into surgery.  Leave suitcase in the car. After surgery it may be brought to your room.  For patients admitted to the hospital, checkout time is 11:00 AM the day of              discharge.   Patients discharged the day of surgery will not be allowed to drive             home.  Name and phone number of your driver: NA  Special Instructions:      Please read over the following fact sheets that you were given:   Surgical Site Infection Prevention

## 2014-01-19 ENCOUNTER — Encounter (HOSPITAL_COMMUNITY): Payer: 59 | Admitting: Anesthesiology

## 2014-01-19 ENCOUNTER — Inpatient Hospital Stay (HOSPITAL_COMMUNITY): Payer: 59 | Admitting: Anesthesiology

## 2014-01-19 ENCOUNTER — Encounter (HOSPITAL_COMMUNITY): Payer: Self-pay | Admitting: Anesthesiology

## 2014-01-19 ENCOUNTER — Inpatient Hospital Stay (HOSPITAL_COMMUNITY)
Admission: AD | Admit: 2014-01-19 | Discharge: 2014-01-22 | DRG: 765 | Disposition: A | Discharge status: Home / Self Care | Payer: 59 | Source: Ambulatory Visit | Attending: Obstetrics and Gynecology | Admitting: Obstetrics and Gynecology

## 2014-01-19 ENCOUNTER — Encounter (HOSPITAL_COMMUNITY)
Admission: AD | Disposition: A | Discharge status: Home / Self Care | Payer: Self-pay | Source: Ambulatory Visit | Attending: Obstetrics and Gynecology

## 2014-01-19 DIAGNOSIS — O34219 Maternal care for unspecified type scar from previous cesarean delivery: Principal | ICD-10-CM | POA: Diagnosis present

## 2014-01-19 DIAGNOSIS — K219 Gastro-esophageal reflux disease without esophagitis: Secondary | ICD-10-CM | POA: Diagnosis present

## 2014-01-19 DIAGNOSIS — O09529 Supervision of elderly multigravida, unspecified trimester: Secondary | ICD-10-CM | POA: Diagnosis present

## 2014-01-19 DIAGNOSIS — Z6841 Body Mass Index (BMI) 40.0 and over, adult: Secondary | ICD-10-CM

## 2014-01-19 DIAGNOSIS — E669 Obesity, unspecified: Secondary | ICD-10-CM | POA: Diagnosis present

## 2014-01-19 DIAGNOSIS — D649 Anemia, unspecified: Secondary | ICD-10-CM

## 2014-01-19 DIAGNOSIS — O99214 Obesity complicating childbirth: Secondary | ICD-10-CM

## 2014-01-19 DIAGNOSIS — Z8249 Family history of ischemic heart disease and other diseases of the circulatory system: Secondary | ICD-10-CM | POA: Diagnosis not present

## 2014-01-19 DIAGNOSIS — O9902 Anemia complicating childbirth: Secondary | ICD-10-CM | POA: Diagnosis present

## 2014-01-19 DIAGNOSIS — Z833 Family history of diabetes mellitus: Secondary | ICD-10-CM

## 2014-01-19 LAB — CBC
HEMATOCRIT: 30.8 % — AB (ref 36.0–46.0)
HEMOGLOBIN: 9.4 g/dL — AB (ref 12.0–15.0)
MCH: 23.6 pg — ABNORMAL LOW (ref 26.0–34.0)
MCHC: 30.5 g/dL (ref 30.0–36.0)
MCV: 77.2 fL — ABNORMAL LOW (ref 78.0–100.0)
Platelets: 247 10*3/uL (ref 150–400)
RBC: 3.99 MIL/uL (ref 3.87–5.11)
RDW: 17.4 % — ABNORMAL HIGH (ref 11.5–15.5)
WBC: 18.5 10*3/uL — ABNORMAL HIGH (ref 4.0–10.5)

## 2014-01-19 LAB — CREATININE, SERUM
CREATININE: 0.49 mg/dL — AB (ref 0.50–1.10)
GFR calc Af Amer: >90 mL/min (ref >90)
GFR calc non Af Amer: >90 mL/min (ref >90)

## 2014-01-19 LAB — PREPARE RBC (CROSSMATCH)

## 2014-01-19 SURGERY — Surgical Case
Anesthesia: Spinal

## 2014-01-19 MED ORDER — NALOXONE HCL 1 MG/ML IJ SOLN: 1.0000 ug/kg/h | INTRAVENOUS | Status: DC | PRN

## 2014-01-19 MED ORDER — BUPIVACAINE HCL (PF) 0.25 % IJ SOLN
INTRAMUSCULAR | Status: DC | PRN
  Administered 2014-01-19: 10 mL

## 2014-01-19 MED ORDER — PHENYLEPHRINE 40 MCG/ML (10ML) SYRINGE FOR IV PUSH (FOR BLOOD PRESSURE SUPPORT)
PREFILLED_SYRINGE | INTRAVENOUS | Status: AC
  Filled 2014-01-19: qty 10

## 2014-01-19 MED ORDER — ONDANSETRON HCL 4 MG/2ML IJ SOLN
4.0000 mg | Freq: Three times a day (TID) | INTRAMUSCULAR | Status: DC | PRN
Start: 2014-01-19 — End: 2014-01-22

## 2014-01-19 MED ORDER — CEFAZOLIN SODIUM-DEXTROSE 2-3 GM-% IV SOLR
INTRAVENOUS | Status: AC
  Filled 2014-01-19: qty 50

## 2014-01-19 MED ORDER — ATROPINE SULFATE 0.4 MG/ML IJ SOLN
INTRAMUSCULAR | Status: AC
  Filled 2014-01-19: qty 1

## 2014-01-19 MED ORDER — DIBUCAINE 1 % RE OINT: 1.0000 "application " | TOPICAL_OINTMENT | RECTAL | Status: DC | PRN

## 2014-01-19 MED ORDER — LANOLIN HYDROUS EX OINT: 1.0000 "application " | TOPICAL_OINTMENT | CUTANEOUS | Status: DC | PRN

## 2014-01-19 MED ORDER — MORPHINE SULFATE (PF) 0.5 MG/ML IJ SOLN
INTRAMUSCULAR | Status: DC | PRN
  Administered 2014-01-19: 4.85 mg via INTRAVENOUS

## 2014-01-19 MED ORDER — MORPHINE SULFATE 0.5 MG/ML IJ SOLN
INTRAMUSCULAR | Status: AC
  Filled 2014-01-19: qty 10

## 2014-01-19 MED ORDER — MEASLES, MUMPS & RUBELLA VAC ~~LOC~~ INJ: 0.5000 mL | INJECTION | Freq: Once | SUBCUTANEOUS | Status: DC

## 2014-01-19 MED ORDER — OXYCODONE-ACETAMINOPHEN 5-325 MG PO TABS
1.0000 | ORAL_TABLET | ORAL | Status: DC | PRN
  Filled 2014-01-19: qty 1

## 2014-01-19 MED ORDER — EPHEDRINE 5 MG/ML INJ
INTRAVENOUS | Status: AC
  Filled 2014-01-19: qty 10

## 2014-01-19 MED ORDER — DIPHENHYDRAMINE HCL 50 MG/ML IJ SOLN: 12.5000 mg | INTRAMUSCULAR | Status: DC | PRN

## 2014-01-19 MED ORDER — KETOROLAC TROMETHAMINE 30 MG/ML IJ SOLN: 30.0000 mg | Freq: Four times a day (QID) | INTRAMUSCULAR | Status: AC | PRN

## 2014-01-19 MED ORDER — DIPHENHYDRAMINE HCL 50 MG/ML IJ SOLN: 25.0000 mg | INTRAMUSCULAR | Status: DC | PRN

## 2014-01-19 MED ORDER — ONDANSETRON HCL 4 MG/2ML IJ SOLN
INTRAMUSCULAR | Status: AC
  Filled 2014-01-19: qty 2

## 2014-01-19 MED ORDER — BUPIVACAINE HCL (PF) 0.25 % IJ SOLN
INTRAMUSCULAR | Status: AC
  Filled 2014-01-19: qty 30

## 2014-01-19 MED ORDER — KETOROLAC TROMETHAMINE 30 MG/ML IJ SOLN
INTRAMUSCULAR | Status: AC
  Administered 2014-01-19: 30 mg via INTRAVENOUS
  Filled 2014-01-19: qty 1

## 2014-01-19 MED ORDER — DIPHENHYDRAMINE HCL 25 MG PO CAPS: 25.0000 mg | ORAL_CAPSULE | Freq: Four times a day (QID) | ORAL | Status: DC | PRN

## 2014-01-19 MED ORDER — NALOXONE HCL 0.4 MG/ML IJ SOLN: 0.4000 mg | INTRAMUSCULAR | Status: DC | PRN

## 2014-01-19 MED ORDER — FENTANYL CITRATE 0.05 MG/ML IJ SOLN: 25.0000 ug | INTRAMUSCULAR | Status: DC | PRN

## 2014-01-19 MED ORDER — MEPERIDINE HCL 25 MG/ML IJ SOLN: 6.2500 mg | INTRAMUSCULAR | Status: DC | PRN

## 2014-01-19 MED ORDER — PHENYLEPHRINE HCL 10 MG/ML IJ SOLN
INTRAMUSCULAR | Status: DC | PRN
  Administered 2014-01-19 (×3): 80 ug via INTRAVENOUS

## 2014-01-19 MED ORDER — SCOPOLAMINE 1 MG/3DAYS TD PT72
MEDICATED_PATCH | TRANSDERMAL | Status: AC
  Administered 2014-01-19: 1.5 mg via TRANSDERMAL
  Filled 2014-01-19: qty 1

## 2014-01-19 MED ORDER — HEPARIN SODIUM (PORCINE) 5000 UNIT/ML IJ SOLN
5000.0000 [IU] | Freq: Three times a day (TID) | INTRAMUSCULAR | Status: DC
  Administered 2014-01-19 – 2014-01-22 (×8): 5000 [IU] via SUBCUTANEOUS
  Filled 2014-01-19 (×9): qty 1

## 2014-01-19 MED ORDER — PRENATAL MULTIVITAMIN CH
1.0000 | ORAL_TABLET | Freq: Every day | ORAL | Status: DC
  Administered 2014-01-20 – 2014-01-21 (×2): 1 via ORAL
  Filled 2014-01-19 (×2): qty 1

## 2014-01-19 MED ORDER — METHYLERGONOVINE MALEATE 0.2 MG/ML IJ SOLN: 0.2000 mg | INTRAMUSCULAR | Status: DC | PRN

## 2014-01-19 MED ORDER — EPHEDRINE SULFATE 50 MG/ML IJ SOLN
INTRAMUSCULAR | Status: DC | PRN
  Administered 2014-01-19 (×2): 10 mg via INTRAVENOUS

## 2014-01-19 MED ORDER — OXYTOCIN 10 UNIT/ML IJ SOLN
INTRAMUSCULAR | Status: AC
  Filled 2014-01-19: qty 4

## 2014-01-19 MED ORDER — FENTANYL CITRATE 0.05 MG/ML IJ SOLN
INTRAMUSCULAR | Status: DC | PRN
  Administered 2014-01-19: 75 ug via INTRAVENOUS

## 2014-01-19 MED ORDER — SIMETHICONE 80 MG PO CHEW
80.0000 mg | CHEWABLE_TABLET | ORAL | Status: DC | PRN
  Filled 2014-01-19: qty 1

## 2014-01-19 MED ORDER — LACTATED RINGERS IV SOLN
INTRAVENOUS | Status: DC
  Administered 2014-01-19: 23:00:00 via INTRAVENOUS

## 2014-01-19 MED ORDER — FENTANYL CITRATE 0.05 MG/ML IJ SOLN
INTRAMUSCULAR | Status: AC
  Filled 2014-01-19: qty 2

## 2014-01-19 MED ORDER — WITCH HAZEL-GLYCERIN EX PADS: 1.0000 "application " | MEDICATED_PAD | CUTANEOUS | Status: DC | PRN

## 2014-01-19 MED ORDER — KETOROLAC TROMETHAMINE 30 MG/ML IJ SOLN
15.0000 mg | Freq: Once | INTRAMUSCULAR | Status: AC | PRN
  Administered 2014-01-19: 30 mg via INTRAVENOUS

## 2014-01-19 MED ORDER — METOCLOPRAMIDE HCL 5 MG/ML IJ SOLN: 10.0000 mg | Freq: Three times a day (TID) | INTRAMUSCULAR | Status: DC | PRN

## 2014-01-19 MED ORDER — SIMETHICONE 80 MG PO CHEW
80.0000 mg | CHEWABLE_TABLET | ORAL | Status: DC
Start: 2014-01-20 — End: 2014-01-22
  Administered 2014-01-20 – 2014-01-22 (×3): 80 mg via ORAL
  Filled 2014-01-19 (×3): qty 1

## 2014-01-19 MED ORDER — MORPHINE SULFATE (PF) 0.5 MG/ML IJ SOLN: INTRAMUSCULAR | Status: DC | PRN

## 2014-01-19 MED ORDER — ONDANSETRON HCL 4 MG/2ML IJ SOLN
4.0000 mg | INTRAMUSCULAR | Status: DC | PRN
  Administered 2014-01-19: 4 mg via INTRAVENOUS
  Filled 2014-01-19: qty 2

## 2014-01-19 MED ORDER — SODIUM CHLORIDE 0.9 % IJ SOLN: 3.0000 mL | INTRAMUSCULAR | Status: DC | PRN

## 2014-01-19 MED ORDER — OXYTOCIN 10 UNIT/ML IJ SOLN
40.0000 [IU] | INTRAVENOUS | Status: DC | PRN
  Administered 2014-01-19: 40 [IU] via INTRAVENOUS

## 2014-01-19 MED ORDER — PANTOPRAZOLE SODIUM 40 MG PO TBEC
40.0000 mg | DELAYED_RELEASE_TABLET | Freq: Every day | ORAL | Status: DC
  Administered 2014-01-20 – 2014-01-21 (×2): 40 mg via ORAL
  Filled 2014-01-19 (×2): qty 1

## 2014-01-19 MED ORDER — OXYTOCIN 40 UNITS IN LACTATED RINGERS INFUSION - SIMPLE MED: 62.5000 mL/h | INTRAVENOUS | Status: AC

## 2014-01-19 MED ORDER — SIMETHICONE 80 MG PO CHEW
80.0000 mg | CHEWABLE_TABLET | Freq: Three times a day (TID) | ORAL | Status: DC
  Administered 2014-01-20 – 2014-01-21 (×7): 80 mg via ORAL
  Filled 2014-01-19 (×7): qty 1

## 2014-01-19 MED ORDER — SCOPOLAMINE 1 MG/3DAYS TD PT72: 1.0000 | MEDICATED_PATCH | Freq: Once | TRANSDERMAL | Status: DC

## 2014-01-19 MED ORDER — TRAMADOL HCL 50 MG PO TABS
100.0000 mg | ORAL_TABLET | Freq: Four times a day (QID) | ORAL | Status: DC | PRN
Start: 2014-01-19 — End: 2014-01-22
  Administered 2014-01-21 (×3): 100 mg via ORAL
  Filled 2014-01-19 (×4): qty 2

## 2014-01-19 MED ORDER — TETANUS-DIPHTH-ACELL PERTUSSIS 5-2.5-18.5 LF-MCG/0.5 IM SUSP: 0.5000 mL | Freq: Once | INTRAMUSCULAR | Status: DC

## 2014-01-19 MED ORDER — METHYLERGONOVINE MALEATE 0.2 MG PO TABS: 0.2000 mg | ORAL_TABLET | ORAL | Status: DC | PRN

## 2014-01-19 MED ORDER — PHENYLEPHRINE HCL 10 MG/ML IJ SOLN
INTRAMUSCULAR | Status: AC
  Filled 2014-01-19: qty 1

## 2014-01-19 MED ORDER — MENTHOL 3 MG MT LOZG: 1.0000 | LOZENGE | OROMUCOSAL | Status: DC | PRN

## 2014-01-19 MED ORDER — IBUPROFEN 600 MG PO TABS: 600.0000 mg | ORAL_TABLET | Freq: Four times a day (QID) | ORAL | Status: DC | PRN

## 2014-01-19 MED ORDER — 0.9 % SODIUM CHLORIDE (POUR BTL) OPTIME
TOPICAL | Status: DC | PRN
  Administered 2014-01-19: 1000 mL

## 2014-01-19 MED ORDER — PROMETHAZINE HCL 25 MG/ML IJ SOLN: 6.2500 mg | INTRAMUSCULAR | Status: DC | PRN

## 2014-01-19 MED ORDER — CEFAZOLIN SODIUM-DEXTROSE 2-3 GM-% IV SOLR
2.0000 g | INTRAVENOUS | Status: AC
  Administered 2014-01-19: 2 g via INTRAVENOUS

## 2014-01-19 MED ORDER — ONDANSETRON HCL 4 MG/2ML IJ SOLN
INTRAMUSCULAR | Status: DC | PRN
  Administered 2014-01-19: 4 mg via INTRAVENOUS

## 2014-01-19 MED ORDER — IBUPROFEN 600 MG PO TABS
600.0000 mg | ORAL_TABLET | Freq: Four times a day (QID) | ORAL | Status: DC
  Administered 2014-01-20 – 2014-01-22 (×10): 600 mg via ORAL
  Filled 2014-01-19 (×10): qty 1

## 2014-01-19 MED ORDER — SCOPOLAMINE 1 MG/3DAYS TD PT72
1.0000 | MEDICATED_PATCH | Freq: Once | TRANSDERMAL | Status: DC
  Administered 2014-01-19: 1.5 mg via TRANSDERMAL

## 2014-01-19 MED ORDER — ATROPINE SULFATE 0.4 MG/ML IJ SOLN
INTRAMUSCULAR | Status: DC | PRN
  Administered 2014-01-19 (×2): 0.2 mg via INTRAVENOUS

## 2014-01-19 MED ORDER — ZOLPIDEM TARTRATE 5 MG PO TABS: 5.0000 mg | ORAL_TABLET | Freq: Every evening | ORAL | Status: DC | PRN

## 2014-01-19 MED ORDER — SENNOSIDES-DOCUSATE SODIUM 8.6-50 MG PO TABS
2.0000 | ORAL_TABLET | ORAL | Status: DC
  Administered 2014-01-20 – 2014-01-22 (×3): 2 via ORAL
  Filled 2014-01-19 (×3): qty 2

## 2014-01-19 MED ORDER — FERROUS SULFATE 325 (65 FE) MG PO TABS
325.0000 mg | ORAL_TABLET | Freq: Two times a day (BID) | ORAL | Status: DC
  Administered 2014-01-20 – 2014-01-22 (×5): 325 mg via ORAL
  Filled 2014-01-19 (×5): qty 1

## 2014-01-19 MED ORDER — DIPHENHYDRAMINE HCL 25 MG PO CAPS
25.0000 mg | ORAL_CAPSULE | ORAL | Status: DC | PRN
  Filled 2014-01-19: qty 1

## 2014-01-19 MED ORDER — ONDANSETRON HCL 4 MG PO TABS
4.0000 mg | ORAL_TABLET | ORAL | Status: DC | PRN
  Administered 2014-01-21: 4 mg via ORAL
  Filled 2014-01-19: qty 1

## 2014-01-19 MED ORDER — LACTATED RINGERS IV SOLN
INTRAVENOUS | Status: DC
  Administered 2014-01-19 (×5): via INTRAVENOUS

## 2014-01-19 MED ORDER — PHENYLEPHRINE 8 MG IN D5W 100 ML (0.08MG/ML) PREMIX OPTIME
INJECTION | INTRAVENOUS | Status: DC | PRN
Start: 2014-01-19 — End: 2014-01-19
  Administered 2014-01-19: 60 ug/min via INTRAVENOUS

## 2014-01-19 SURGICAL SUPPLY — 42 items
APL SKNCLS STERI-STRIP NONHPOA (GAUZE/BANDAGES/DRESSINGS) ×1
BENZOIN TINCTURE PRP APPL 2/3 (GAUZE/BANDAGES/DRESSINGS) ×3 IMPLANT
BLADE SURG 10 STRL SS (BLADE) ×6 IMPLANT
BOOTIES KNEE HIGH SLOAN (MISCELLANEOUS) ×6 IMPLANT
CLAMP CORD UMBIL (MISCELLANEOUS) IMPLANT
CLOSURE WOUND 1/2 X4 (GAUZE/BANDAGES/DRESSINGS) ×1
CLOTH BEACON ORANGE TIMEOUT ST (SAFETY) ×3 IMPLANT
DRAIN JACKSON PRT FLT 10 (DRAIN) IMPLANT
DRAPE LG THREE QUARTER DISP (DRAPES) IMPLANT
DRSG OPSITE POSTOP 4X10 (GAUZE/BANDAGES/DRESSINGS) ×3 IMPLANT
DURAPREP 26ML APPLICATOR (WOUND CARE) ×3 IMPLANT
ELECT REM PT RETURN 9FT ADLT (ELECTROSURGICAL) ×3
ELECTRODE REM PT RTRN 9FT ADLT (ELECTROSURGICAL) ×1 IMPLANT
EVACUATOR SILICONE 100CC (DRAIN) IMPLANT
EXTRACTOR VACUUM M CUP 4 TUBE (SUCTIONS) IMPLANT
EXTRACTOR VACUUM M CUP 4' TUBE (SUCTIONS)
GLOVE BIOGEL PI IND STRL 7.0 (GLOVE) ×1 IMPLANT
GLOVE BIOGEL PI INDICATOR 7.0 (GLOVE) ×2
GLOVE ECLIPSE 6.5 STRL STRAW (GLOVE) ×3 IMPLANT
GOWN STRL REUS W/TWL LRG LVL3 (GOWN DISPOSABLE) ×6 IMPLANT
KIT ABG SYR 3ML LUER SLIP (SYRINGE) IMPLANT
NDL HYPO 25X5/8 SAFETYGLIDE (NEEDLE) IMPLANT
NEEDLE HYPO 22GX1.5 SAFETY (NEEDLE) ×3 IMPLANT
NEEDLE HYPO 25X5/8 SAFETYGLIDE (NEEDLE) ×3 IMPLANT
NS IRRIG 1000ML POUR BTL (IV SOLUTION) ×6 IMPLANT
PACK C SECTION WH (CUSTOM PROCEDURE TRAY) ×3 IMPLANT
PAD ABD 8X7 1/2 STERILE (GAUZE/BANDAGES/DRESSINGS) ×2 IMPLANT
PAD OB MATERNITY 4.3X12.25 (PERSONAL CARE ITEMS) ×3 IMPLANT
RTRCTR C-SECT PINK 25CM LRG (MISCELLANEOUS) ×3 IMPLANT
SPONGE GAUZE 4X4 12PLY STER LF (GAUZE/BANDAGES/DRESSINGS) ×2 IMPLANT
STRIP CLOSURE SKIN 1/2X4 (GAUZE/BANDAGES/DRESSINGS) ×2 IMPLANT
SUT CHROMIC GUT AB #0 18 (SUTURE) IMPLANT
SUT MNCRL AB 3-0 PS2 27 (SUTURE) ×3 IMPLANT
SUT SILK 2 0 FSL 18 (SUTURE) IMPLANT
SUT VIC AB 0 CTX 36 (SUTURE) ×6
SUT VIC AB 0 CTX36XBRD ANBCTRL (SUTURE) ×2 IMPLANT
SUT VIC AB 1 CT1 36 (SUTURE) ×6 IMPLANT
SYRINGE 20CC LL (MISCELLANEOUS) ×3 IMPLANT
TAPE CLOTH SURG 4X10 WHT LF (GAUZE/BANDAGES/DRESSINGS) ×2 IMPLANT
TOWEL OR 17X24 6PK STRL BLUE (TOWEL DISPOSABLE) ×3 IMPLANT
TRAY FOLEY CATH 14FR (SET/KITS/TRAYS/PACK) ×3 IMPLANT
WATER STERILE IRR 1000ML POUR (IV SOLUTION) ×3 IMPLANT

## 2014-01-19 NOTE — H&P (View-Only) (Signed)
Krystal Kim is a 38 y.o. female, G4P3003 at 39.6 weeks, schedule repeat CS  Patient Active Problem List   Diagnosis Date Noted  . Stye 05/12/2013  . Bronchitis 03/01/2012  . Obesity 04/21/2011    Pregnancy Course: Patient entered care at 10.6 weeks.   EDC of 01/20/2014 was established by US.   Anatomy scan:  19.2 weeks, with normal findings except RVOT, LVOT, DA, AA and the 5th digits were not seen because of BMI and an anterior placenta.   Additional US evaluations:  12.6 weeks -1st trimester screen  wnl FU US at 22.6 weeks, EFW 1lb 4oz-55th %tile, RVOT, LVOT, DA, AA and the 5th still not sell visualized BPP 8/8 and AFI 12.64 at 34.0 weeks BPP 6/8, AFI 13.50, EFW 6lb 1oz-77th %tile at 35.3 BPP 8/8 and AFI  13.63 at 36.0 weeks BPP 8/8 and AFI 11.64 at 38.0 weeks BPP 8/8 and AFI 12.2 at 39.0  Significant prenatal events:  GERD, Hx CS x2 , failed 1 hour gtt, passed 3 hour gtt Last evaluation:  39 weeks   VE:0/20/-3 on 01/13/2014  Reason for admission: schedule repeat cs  Pt States:     Contractions Frequency: none     Contraction severity: none     Fetal activity: +FM   OB History   Grav Para Term Preterm Abortions TAB SAB Ect Mult Living   4 3        3      Past Medical History  Diagnosis Date  . Anemia   . Seasonal allergies   . Obesity   . PONV (postoperative nausea and vomiting)    Past Surgical History  Procedure Laterality Date  . Cesarean section    . Vagina reconstruction surgery     Family History: family history includes Diabetes in her father and maternal aunt; Heart disease in her maternal grandmother; Hypertension in her son. Social History:  reports that she has never smoked. She does not have any smokeless tobacco history on file. She reports that she does not drink alcohol or use illicit drugs.   Prenatal Transfer Tool  Maternal Diabetes: No Genetic Screening: Normal Maternal Ultrasounds/Referrals: Normal Fetal Ultrasounds or other Referrals:   None Maternal Substance Abuse:  No Significant Maternal Medications:  Meds include: Other: pantoprazole 40mg  qd Significant Maternal Lab Results: Lab values include: Group B Strep negative   ROS:  See HPI above, all other systems are negative  Allergies  Allergen Reactions  . Codeine     REACTION: Nausea and vomiting       Blood pressure 117/81, pulse 84, height 5\' 3"  (1.6 m), weight 258 lb (117.028 kg), last menstrual period 04/15/2013, SpO2 100.00%.   Maternal Exam:  Uterine Assessment: Contraction none Abdomen: Gravid, non tender. Fundal height is aga.  Normal external genitalia, vulva, cervix, uterus and adnexa.  No lesions noted on exam.  Fetal presentation: Vertex by US on 01/13/14   Fetal Exam:  Fetal Monitor Surveillance : Continuous Monitoring Mode: Ultrasound.  CTXs: n/a EFW   8lbs   Physical Exam: Nursing note and vitals reviewed General: alert and cooperative She appears well nourished.  Psychiatric: Normal mood and affect. Her behavior is normal.  Head: Normocephalic.  Eyes: Pupils are equal, round, and reactive to light.  Neck: Normal range of motion.  Cardiovascular: RRR without murmur.  Respiratory: CTAB. Effort normal.  Abd: soft, non-tender, +BS, no rebound, no guarding  Genitourinary: Vagina normal.  Musculoskeletal: Normal range of motion.  Ext: WNL, No evidence  of DVT seen on physical exam. Homan's sign negative bilaterally Minimal bilaterally non-pitting edema Neurological: A&Ox3.  Normal reflexes.  Skin: Warm and dry.    Prenatal labs: ABO, Rh: --/--/O POS, O POS (08/07 0845) Antibody: NEG (08/07 0845) Rubella:   immune RPR: NON REAC (08/07 0845)  HBsAg: Negative (12/29 0919)  HIV: Non-reactive (12/29 0919)  GBS:  negative Sickle cell/Hgb electrophoresis:  WNL Pap:  1/2015vwnl GC:  neg Chlamydia:  neg Genetic screenings:  wnl Glucola:  wnl   Assessment IUP at 39.6 Weeks Membrane: intact GBS neg Schedule repeat  cs   Plan: Admit to BS for schedule repeat CS R&B of CS reviewed with patient and family.  Pt and family verbalize understanding of the procedure and agree with treatment plan.  Possibility of needing a blood transfusion reviewed.   Pt will accept blood products. Routine Pre-OP orders Ancef per protocol May have a clear/thin diet 6 hours after CS, advance as tolerate 12 hours after CS     Arriyanna Mersch, CNM, MSN 01/19/2014, 10:59 AM

## 2014-01-19 NOTE — Transfer of Care (Signed)
Immediate Anesthesia Transfer of Care Note  Patient: Krystal Kim  Procedure(s) Performed: Procedure(s): REPEAT CESAREAN SECTION (N/A)  Patient Location: PACU  Anesthesia Type:Spinal  Level of Consciousness: awake, alert  and oriented  Airway & Oxygen Therapy: Patient Spontanous Breathing  Post-op Assessment: Report given to PACU RN and Post -op Vital signs reviewed and stable  Post vital signs: Reviewed and stable  Complications: No apparent anesthesia complications

## 2014-01-19 NOTE — H&P (Signed)
Krystal Kim is a 38 y.o. female, G4P3003 at 39.6 weeks, schedule repeat CS  Patient Active Problem List   Diagnosis Date Noted  . Stye 05/12/2013  . Bronchitis 03/01/2012  . Obesity 04/21/2011    Pregnancy Course: Patient entered care at 10.6 weeks.   EDC of 01/20/2014 was established by US.   Anatomy scan:  19.2 weeks, with normal findings except RVOT, LVOT, DA, AA and the 5th digits were not seen because of BMI and an anterior placenta.   Additional US evaluations:  12.6 weeks -1st trimester screen  wnl FU US at 22.6 weeks, EFW 1lb 4oz-55th %tile, RVOT, LVOT, DA, AA and the 5th still not sell visualized BPP 8/8 and AFI 12.64 at 34.0 weeks BPP 6/8, AFI 13.50, EFW 6lb 1oz-77th %tile at 35.3 BPP 8/8 and AFI  13.63 at 36.0 weeks BPP 8/8 and AFI 11.64 at 38.0 weeks BPP 8/8 and AFI 12.2 at 39.0  Significant prenatal events:  GERD, Hx CS x2 , failed 1 hour gtt, passed 3 hour gtt Last evaluation:  39 weeks   VE:0/20/-3 on 01/13/2014  Reason for admission: schedule repeat cs  Pt States:     Contractions Frequency: none     Contraction severity: none     Fetal activity: +FM   OB History   Grav Para Term Preterm Abortions TAB SAB Ect Mult Living   4 3        3      Past Medical History  Diagnosis Date  . Anemia   . Seasonal allergies   . Obesity   . PONV (postoperative nausea and vomiting)    Past Surgical History  Procedure Laterality Date  . Cesarean section    . Vagina reconstruction surgery     Family History: family history includes Diabetes in her father and maternal aunt; Heart disease in her maternal grandmother; Hypertension in her son. Social History:  reports that she has never smoked. She does not have any smokeless tobacco history on file. She reports that she does not drink alcohol or use illicit drugs.   Prenatal Transfer Tool  Maternal Diabetes: No Genetic Screening: Normal Maternal Ultrasounds/Referrals: Normal Fetal Ultrasounds or other Referrals:   None Maternal Substance Abuse:  No Significant Maternal Medications:  Meds include: Other: pantoprazole 40mg  qd Significant Maternal Lab Results: Lab values include: Group B Strep negative   ROS:  See HPI above, all other systems are negative  Allergies  Allergen Reactions  . Codeine     REACTION: Nausea and vomiting       Blood pressure 117/81, pulse 84, height 5\' 3"  (1.6 m), weight 258 lb (117.028 kg), last menstrual period 04/15/2013, SpO2 100.00%.   Maternal Exam:  Uterine Assessment: Contraction none Abdomen: Gravid, non tender. Fundal height is aga.  Normal external genitalia, vulva, cervix, uterus and adnexa.  No lesions noted on exam.  Fetal presentation: Vertex by US on 01/13/14   Fetal Exam:  Fetal Monitor Surveillance : Continuous Monitoring Mode: Ultrasound.  CTXs: n/a EFW   8lbs   Physical Exam: Nursing note and vitals reviewed General: alert and cooperative She appears well nourished.  Psychiatric: Normal mood and affect. Her behavior is normal.  Head: Normocephalic.  Eyes: Pupils are equal, round, and reactive to light.  Neck: Normal range of motion.  Cardiovascular: RRR without murmur.  Respiratory: CTAB. Effort normal.  Abd: soft, non-tender, +BS, no rebound, no guarding  Genitourinary: Vagina normal.  Musculoskeletal: Normal range of motion.  Ext: WNL, No evidence  of DVT seen on physical exam. Homan's sign negative bilaterally Minimal bilaterally non-pitting edema Neurological: A&Ox3.  Normal reflexes.  Skin: Warm and dry.    Prenatal labs: ABO, Rh: --/--/O POS, O POS (08/07 0845) Antibody: NEG (08/07 0845) Rubella:   immune RPR: NON REAC (08/07 0845)  HBsAg: Negative (12/29 0919)  HIV: Non-reactive (12/29 0919)  GBS:  negative Sickle cell/Hgb electrophoresis:  WNL Pap:  1/2015vwnl GC:  neg Chlamydia:  neg Genetic screenings:  wnl Glucola:  wnl   Assessment IUP at 39.6 Weeks Membrane: intact GBS neg Schedule repeat  cs   Plan: Admit to BS for schedule repeat CS R&B of CS reviewed with patient and family.  Pt and family verbalize understanding of the procedure and agree with treatment plan.  Possibility of needing a blood transfusion reviewed.   Pt will accept blood products. Routine Pre-OP orders Ancef per protocol May have a clear/thin diet 6 hours after CS, advance as tolerate 12 hours after CS     Montez Cuda, CNM, MSN 01/19/2014, 10:59 AM

## 2014-01-19 NOTE — Interval H&P Note (Signed)
History and Physical Interval Note:  01/19/2014 2:28 PM  Krystal Kim  has presented today for surgery, with the diagnosis of Two Previous Cesarean Sections  The various methods of treatment have been discussed with the patient and family. After consideration of risks, benefits and other options for treatment, the patient has consented to  Procedure(s): REPEAT CESAREAN SECTION (N/A) as a surgical intervention .  The patient's history has been reviewed, patient examined, no change in status, stable for surgery.  I have reviewed the patient's chart and labs.  Questions were answered to the patient's satisfaction.     Wilmina Maxham A

## 2014-01-19 NOTE — Anesthesia Preprocedure Evaluation (Signed)
Anesthesia Evaluation  Patient identified by MRN, date of birth, ID band Patient awake    Reviewed: Allergy & Precautions, H&P , Patient's Chart, lab work & pertinent test results  Airway Mallampati: II TM Distance: >3 FB Neck ROM: full    Dental no notable dental hx.    Pulmonary neg pulmonary ROS,    Pulmonary exam normal       Cardiovascular negative cardio ROS      Neuro/Psych negative neurological ROS  negative psych ROS   GI/Hepatic negative GI ROS, Neg liver ROS,   Endo/Other  Morbid obesity  Renal/GU negative Renal ROS     Musculoskeletal   Abdominal (+) + obese,   Peds  Hematology   Anesthesia Other Findings   Reproductive/Obstetrics (+) Pregnancy                           Anesthesia Physical Anesthesia Plan  ASA: III  Anesthesia Plan: Spinal   Post-op Pain Management:    Induction:   Airway Management Planned:   Additional Equipment:   Intra-op Plan:   Post-operative Plan:   Informed Consent: I have reviewed the patients History and Physical, chart, labs and discussed the procedure including the risks, benefits and alternatives for the proposed anesthesia with the patient or authorized representative who has indicated his/her understanding and acceptance.     Plan Discussed with: CRNA and Surgeon  Anesthesia Plan Comments:         Anesthesia Quick Evaluation

## 2014-01-19 NOTE — Anesthesia Postprocedure Evaluation (Signed)
Anesthesia Post Note  Patient: Krystal Kim  Procedure(s) Performed: Procedure(s) (LRB): REPEAT CESAREAN SECTION (N/A)  Anesthesia type: Spinal  Patient location: PACU  Post pain: Pain level controlled  Post assessment: Post-op Vital signs reviewed  Last Vitals:  Filed Vitals:   01/19/14 1815  BP: 105/51  Pulse: 69  Temp:   Resp: 15    Post vital signs: Reviewed  Level of consciousness: awake  Complications: No apparent anesthesia complications

## 2014-01-19 NOTE — Op Note (Signed)
Preoperative diagnosis: Intrauterine pregnancy at 39 weeks and 6 days with 2 previous cesarean sections  Post operative diagnosis: Same  Anesthesia: Spinal  Anesthesiologist: Dr. Arby BarretteHatchett  Procedure: Repeat low transverse cesarean section  Surgeon: Dr. Dois DavenportSandra Terrion Poblano  Assistant: Venus Standard CNM  Estimated blood loss: 600 cc  Procedure:  After being informed of the planned procedure and possible complications including bleeding, infection, injury to other organs, informed consent is obtained. The patient is taken to OR #2 and given spinal anesthesia without complication. She is placed in the dorsal decubitus position with the pelvis tilted to the left. She is then prepped and draped in Kim sterile fashion. Kim Foley catheter is inserted in her bladder.  After assessing adequate level of anesthesia, we infiltrate the suprapubic area with 20 cc of Marcaine 0.25 and perform Kim Pfannenstiel incision which is brought down sharply to the fascia. The fascia is entered in Kim low transverse fashion. Linea alba is dissected with some difficulty due to adhesions with the omentum. Peritoneum is entered in Kim midline fashion. An Alexis retractor is easily positioned which retracts the omentum in the upper abdomen.  The myometrium is then entered in Kim low transverse fashion after sharply developing Kim bladder flap ; first with knife and then extended bluntly. Amniotic fluid is clear. We assist the birth of Kim female  infant in vertex presentation. Mouth and nose are suctioned. The baby is delivered. The cord is clamped and sectioned. The baby is given to the neonatologist present in the room.  10 cc of blood is drawn from the umbilical vein.The placenta is allowed to deliver spontaneously. It is complete and the cord has 3 vessels. Uterine revision is negative.  We proceed with closure of the myometrium in 2 layers: First with Kim running locked suture of 0 Vicryl, then with Kim Lembert suture of 0 Vicryl imbricating  the first one. Hemostasis is completed with cauterization on peritoneal edges and 2 figure-of-eight stitches of 3-0 Vicryl on the bladder and one stitch of 0-Vicryl on the left angle of the myometrium.  Both paracolic gutters are cleaned. Both tubes and ovaries are assessed and normal. The pelvis is profusely irrigated with warm saline to confirm Kim satisfactory hemostasis.  Retractors and sponges are removed. Under fascia hemostasis is completed with cauterization. The fascia is then closed with 2 running sutures of 0 Vicryl meeting midline. The wound is irrigated with warm saline and hemostasis is completed with cauterization. The skin is closed with Kim subcuticular suture of 3-0 Monocryl and Steri-Strips after placing Kim #10 JP drain in the incision.  Instrument and sponge count is complete x2. Estimated blood loss is 600 cc.  The procedure is well tolerated by the patient who is taken to recovery room in Kim well and stable condition.  female baby named Rosalyn GessGrayson was born at 15:47 and received an Apgar of 9  at 1 minute and 9 at 5 minutes.    Specimen: Placenta sent to [facility]   Krystal Kuzel A MD 8/10/20154:40 PM

## 2014-01-19 NOTE — Anesthesia Procedure Notes (Signed)
Spinal  Patient location during procedure: OR Start time: 01/19/2014 2:40 PM End time: 01/19/2014 2:54 PM Staffing Anesthesiologist: Leilani AbleHATCHETT, Joselynn Amoroso Performed by: anesthesiologist  Preanesthetic Checklist Completed: patient identified, surgical consent, pre-op evaluation, timeout performed, IV checked, risks and benefits discussed and monitors and equipment checked Spinal Block Patient position: sitting Prep: site prepped and draped and DuraPrep Patient monitoring: heart rate, cardiac monitor, continuous pulse ox and blood pressure Approach: midline Location: L3-4 Injection technique: single-shot Needle Needle type: Sprotte  Needle gauge: 24 G Needle length: 9 cm Needle insertion depth: 11 cm Assessment Sensory level: T4 Additional Notes Needed 18G Touhy X 2 to 9cm followed by a 24G Sprotte to + CSF.

## 2014-01-20 ENCOUNTER — Encounter (HOSPITAL_COMMUNITY): Payer: Self-pay | Admitting: Obstetrics and Gynecology

## 2014-01-20 LAB — TYPE AND SCREEN
ABO/RH(D): O POS
ANTIBODY SCREEN: NEGATIVE
UNIT DIVISION: 0
Unit division: 0

## 2014-01-20 LAB — CBC
HCT: 26.6 % — ABNORMAL LOW (ref 36.0–46.0)
Hemoglobin: 8 g/dL — ABNORMAL LOW (ref 12.0–15.0)
MCH: 23.3 pg — ABNORMAL LOW (ref 26.0–34.0)
MCHC: 30.1 g/dL (ref 30.0–36.0)
MCV: 77.6 fL — AB (ref 78.0–100.0)
Platelets: 211 10*3/uL (ref 150–400)
RBC: 3.43 MIL/uL — AB (ref 3.87–5.11)
RDW: 17.4 % — AB (ref 11.5–15.5)
WBC: 10.8 10*3/uL — AB (ref 4.0–10.5)

## 2014-01-20 LAB — BIRTH TISSUE RECOVERY COLLECTION (PLACENTA DONATION)

## 2014-01-20 NOTE — Addendum Note (Signed)
Addendum created 01/20/14 0757 by Graciela HusbandsWynn O Jonni Oelkers, CRNA   Modules edited: Notes Section   Notes Section:  File: [id number]

## 2014-01-20 NOTE — Progress Notes (Signed)
Subjective: Postpartum Day 1: Cesarean Delivery Patient reports that pain is well-managed.Lochia normal.  Ambulating, voiding, tolerating diet as ordered without difficulty. Normal flatus. Absent bowel movement.  Objective: Vital signs in last 24 hours: Temp:  [97.6 F (36.4 C)-98.5 F (36.9 C)] 98.2 F (36.8 C) (08/11 0907) Pulse Rate:  [58-79] 62 (08/11 0410) Resp:  [15-19] 18 (08/11 0907) BP: (96-125)/(40-76) 114/45 mmHg (08/11 0907) SpO2:  [94 %-99 %] 98 % (08/11 0907) Weight:  [258 lb (117.028 kg)] 258 lb (117.028 kg) (08/10 1900)  Physical Exam:  General: alert Lochia: appropriate Uterine Fundus: firm and appropriately tender Incision: healing well DVT Evaluation: No evidence of DVT seen on physical exam. Edema minimal.   Recent Labs  01/19/14 2004 01/20/14 0535  HGB 9.4* 8.0*  HCT 30.8* 26.6*    Assessment/Plan: Status post Cesarean section. Doing well postoperatively.  Continue current care.   Jonmarc Bodkin A 01/20/2014, 9:21 AM

## 2014-01-20 NOTE — Progress Notes (Signed)
Subjective: Postpartum Day 1: repeat Cesarean Delivery Patient up ad lib, reports no syncope or dizziness. Feeding:  breast Contraceptive plan:  Unsure  Objective: Vital signs in last 24 hours: Temp:  [97.6 F (36.4 C)-98.5 F (36.9 C)] 98.2 F (36.8 C) (08/11 0907) Pulse Rate:  [58-79] 62 (08/11 0410) Resp:  [15-19] 18 (08/11 0907) BP: (96-125)/(40-76) 114/45 mmHg (08/11 0907) SpO2:  [94 %-99 %] 98 % (08/11 0907) Weight:  [258 lb (117.028 kg)] 258 lb (117.028 kg) (08/10 1900)  Physical Exam:  General: alert and cooperative Lochia: appropriate Uterine Fundus: firm Abdomen:  + bowel sounds, non distended Incision: no significant drainage  Honeycomb dressing CDI DVT Evaluation: No evidence of DVT seen on physical exam. Homan's sign: Negative   Recent Labs  01/19/14 2004 01/20/14 0535  HGB 9.4* 8.0*  HCT 30.8* 26.6*  WBC 18.5* 10.8*  asymptomatic   Assessment/Plan: Status post Cesarean section day 1 Doing well postoperatively.  Pressure dressing in place, no significant drainage Continue current care. Breastfeeding, Lactation consult and Circumcision prior to discharge Anemia - hemodynamicly stable.  Declines transfusion Pt may shower Iron supplement BID   Dannica Bickham, CNM, MSN 01/20/2014. 9:40 AM

## 2014-01-20 NOTE — Anesthesia Postprocedure Evaluation (Signed)
Anesthesia Post Note  Patient: Krystal Kim  Procedure(s) Performed: Procedure(s) (LRB): REPEAT CESAREAN SECTION (N/A)  Anesthesia type: Spinal  Patient location: Mother/Baby  Post pain: Pain level controlled  Post assessment: Post-op Vital signs reviewed  Last Vitals:  Filed Vitals:   01/20/14 0410  BP: 97/54  Pulse: 62  Temp:   Resp: 18    Post vital signs: Reviewed  Level of consciousness: awake  Complications: No apparent anesthesia complications

## 2014-01-21 NOTE — Progress Notes (Signed)
Patient ID: Krystal Kim, female   DOB: 07-27-1975, 38 y.o.   MRN: [id number] I saw and examined patient at bedside. I agree with findings and assessment of CNM Vicky LAtham.

## 2014-01-21 NOTE — Progress Notes (Signed)
Subjective: Postpartum Day 2: Cesarean Delivery due to previous C/S x 2. Patient up ad lib, reports no syncope or dizziness.  Good pain relief from Tramadol. Feeding:  Bottle Contraceptive plan:  Seasonale, then vasectomy  Objective: Vital signs in last 24 hours: Temp:  [98.1 F (36.7 C)] 98.1 F (36.7 C) (08/11 1703) Pulse Rate:  [90] 90 (08/11 1703) Resp:  [18] 18 (08/11 1703) BP: (113-140)/(51-56) 140/51 mmHg (08/11 1703) SpO2:  [99 %] 99 % (08/11 1219) Weight:  [258 lb (117.028 kg)] 258 lb (117.028 kg) (08/11 2351)  Orthostatics WNL  Physical Exam:  General: alert Lochia: appropriate Uterine Fundus: firm Abdomen:  + bowel sounds, no distension Incision: Honeycomb dressing CDI DVT Evaluation: No evidence of DVT seen on physical exam. Negative Homan's sign. JP drain:   45 cc overnight, serosanguinous drainage.  Receiving Heparin 5000 u TID for DVT prophylaxis   Recent Labs  01/19/14 2004 01/20/14 0535  HGB 9.4* 8.0*  HCT 30.8* 26.6*  WBC 18.5* 10.8*    Assessment/Plan: Status post Cesarean section day 2. Anemia without hemodynamic instability On Heparin for DVT prophylaxis due to surgery and BMI. Doing well postoperatively.  Continue current care. Anticipate d/c tomorrow, with removal of JP drain prior to d/c. R&B of transfusion reviewed with patient--she declines transfusion, and I feel she is stable. Fe BID    Lincoln Kleiner CNM, [place] 01/21/2014, 10:59 AM

## 2014-01-21 NOTE — Discharge Summary (Signed)
Cesarean Section Delivery Discharge Summary  Krystal Kim  DOB:    28-Oct-1975 MRN:    [id number] CSN:    [id number]  Date of admission:                  01/19/14  Date of discharge:                   01/22/14  Procedures this admission:  Repeat LTCS  Date of Delivery: 01/19/14  Newborn Data:  Live born female  Birth Weight: 7 lb 9.2 oz (3435 g) APGAR: 9, 9  Home with mother. Name:  Krystal Kim Circumcision Plan: Inpatient  History of Present Illness:  Ms. Krystal Kim is a 38 y.o. female, G4P1004, who presents at [redacted]w[redacted]d weeks gestation. The patient has been followed at the [facility] and Gynecology division of [facility].    Her pregnancy has been complicated by:  Patient Active Problem List   Diagnosis Date Noted  . Anemia 01/22/2014  . Cesarean delivery delivered 01/19/2014  . Obesity 04/21/2011    Hospital Course--Scheduled Cesarean: Patient was admitted on 01/19/14 for a scheduled repeat cesarean delivery.   She was taken to the operating room, where Dr. Estanislado Pandy performed a repeat LTCS under spinal anesthesia, with delivery of a viable female, with weight and Apgars as listed below. Infant was in good condition and remained at the patient's bedside.  The patient was taken to recovery in good condition.  Patient planned to bottle feed.  On post-op day 1, patient was doing well, tolerating a regular diet, with Hgb of 8.0, down from 9.4 pre-op--she was asymptomatic and had stable orthostatics.  She was placed on Fe BID.  Throughout her stay, her physical exam was WNL, her incision was CDI, and her vital signs remained stable.  By post-op day 3, she was up ad lib, tolerating a regular diet, with good pain control with po meds Ibuprophen and Tramadol--Percocet made her too drowsy.  She was deemed to have received the full benefit of her hospital stay, and was discharged home in stable condition.  Contraceptive choice was to restart Seasonale after 3 weeks  pp.     Feeding:  bottle  Contraception:  oral contraceptives (estrogen/progesterone)  Discharge hemoglobin:  Hemoglobin  Date Value Ref Range Status  01/20/2014 8.0* 12.0 - 15.0 g/dL Final  06/14/7251 9.4* 66.4 - 15.0 g/dL Final  4/0/3474 9.8* 25.9 - 15.0 g/dL Final     HCT  Date Value Ref Range Status  01/20/2014 26.6* 36.0 - 46.0 % Final  01/19/2014 30.8* 36.0 - 46.0 % Final  01/16/2014 32.5* 36.0 - 46.0 % Final     WBC  Date Value Ref Range Status  01/20/2014 10.8* 4.0 - 10.5 K/uL Final  01/19/2014 18.5* 4.0 - 10.5 K/uL Final  01/16/2014 10.2  4.0 - 10.5 K/uL Final    Discharge Physical Exam:   General: alert Lochia: appropriate Uterine Fundus: firm Abdomen:  + bowel sounds, no distension, pannus noted Incision: Honeycomb dressing slightly moist--removed and replaced with new Honeycomb dressing.  Steristrips intact.  JP drain removed without difficulty--approx 17 cc overnight, serosanguinous DVT Evaluation: No evidence of DVT seen on physical exam. Negative Homan's sign.  Intrapartum Procedures: cesarean: low cervical, transverse repeat Postpartum Procedures: none Complications-Operative and Postpartum: anemia without hemodynamic instability  Discharge Diagnoses: Term Pregnancy-delivered and repeat cesarean, anemia  Discharge Information:  Activity:           pelvic rest Diet:  routine Medications: Ibuprofen, Iron and Tramadol, Seasonale. Condition:      stable Instructions:  Discharge to: home  Follow-up Information   [facility] 6 weeks. (Call for any questions or concerns.)    Specialty:  Obstetrics and Gynecology   Contact information:   [address] [place] [id number] [phone]       Nigel BridgemanLATHAM, Rosalia Mcavoy [facility] 01/22/2014 8:07 AM

## 2014-01-22 DIAGNOSIS — D649 Anemia, unspecified: Secondary | ICD-10-CM

## 2014-01-22 MED ORDER — LEVONORGEST-ETH ESTRAD 91-DAY 0.15-0.03 MG PO TABS: 1.0000 | ORAL_TABLET | Freq: Every day | ORAL | Status: DC

## 2014-01-22 MED ORDER — IBUPROFEN 600 MG PO TABS: 600.0000 mg | ORAL_TABLET | Freq: Four times a day (QID) | ORAL | Status: DC | PRN

## 2014-01-22 MED ORDER — TRAMADOL HCL 50 MG PO TABS: 100.0000 mg | ORAL_TABLET | Freq: Four times a day (QID) | ORAL | Status: DC | PRN

## 2014-01-22 MED ORDER — FERROUS SULFATE 325 (65 FE) MG PO TABS: 325.0000 mg | ORAL_TABLET | Freq: Every day | ORAL | Status: DC

## 2014-01-22 NOTE — Discharge Instructions (Signed)

## 2014-04-13 ENCOUNTER — Encounter (HOSPITAL_COMMUNITY): Payer: Self-pay | Admitting: Obstetrics and Gynecology

## 2015-06-01 ENCOUNTER — Telehealth: Payer: 59 | Admitting: Family

## 2015-06-01 DIAGNOSIS — B349 Viral infection, unspecified: Secondary | ICD-10-CM | POA: Diagnosis not present

## 2015-06-01 DIAGNOSIS — J028 Acute pharyngitis due to other specified organisms: Secondary | ICD-10-CM

## 2015-06-01 DIAGNOSIS — B9789 Other viral agents as the cause of diseases classified elsewhere: Secondary | ICD-10-CM

## 2015-06-01 DIAGNOSIS — J029 Acute pharyngitis, unspecified: Secondary | ICD-10-CM | POA: Diagnosis not present

## 2015-06-01 DIAGNOSIS — B9689 Other specified bacterial agents as the cause of diseases classified elsewhere: Secondary | ICD-10-CM

## 2015-06-01 DIAGNOSIS — J329 Chronic sinusitis, unspecified: Secondary | ICD-10-CM

## 2015-06-01 MED ORDER — FLUTICASONE PROPIONATE 50 MCG/ACT NA SUSP: 1.0000 | Freq: Two times a day (BID) | NASAL | Status: DC

## 2015-06-01 MED ORDER — BENZONATATE 100 MG PO CAPS: 100.0000 mg | ORAL_CAPSULE | Freq: Three times a day (TID) | ORAL | Status: DC | PRN

## 2015-06-01 MED ORDER — AZITHROMYCIN 250 MG PO TABS: ORAL_TABLET | ORAL | Status: DC

## 2015-06-01 NOTE — Progress Notes (Addendum)
*Removed the one for viral infection; modified given the new information from the patient.  That is more clear to me now. The E-Visit made it appear to be short-term more in tune with a virus. I will adjust the visit to cough, bacterial respiratory infection and prescribe some antibiotics for you.   Constance Haw, DNP, FNP-BC E-visit program  ----- Message -----    From: Valorie Roosevelt    Sent: 06/01/2015  9:37 AM EST      To: E-Visit Mailing List Subject: RE: E-Visit Submission: Flu like Symptoms  I am new to E Visits so maybe I did not give enough information. This has been going on for over 2 weeks. OTC cold medicines are not helping at all anymore. Both of my children and my husband have the same symptoms and have been diagnosed with a sinus infection and all 3 are now on antibiotics. I am so congested it hurts to blow my nose and to touch my face. My chest hurts when I cough but not all the time. I am coughing up thick yellow/green mucus. I am not someone who wants an unnecessary antibiotic but at this point I think I need something to knock this out.  SEE BELOW:  We are sorry that you are not feeling well.  Here is how we plan to help!  Based on what you have shared with me it looks like you have upper respiratory tract inflammation that has resulted in a significant cough.  Inflammation and infection in the upper respiratory tract is commonly called bronchitis and has four common causes:  Allergies, Viral Infections, Acid Reflux and Bacterial Infections.  Allergies, viruses and acid reflux are treated by controlling symptoms or eliminating the cause. An example might be a cough caused by taking certain blood pressure medications. You stop the cough by changing the medication. Another example might be a cough caused by acid reflux. Controlling the reflux helps control the cough.  Based on your presentation I believe you most likely have A cough due to bacteria.  When patients have a fever  and a productive cough with a change in color or increased sputum production, we are concerned about bacterial bronchitis.  If left untreated it can progress to pneumonia.  If your symptoms do not improve with your treatment plan it is important that you contact your provider.   I have prescribed Azithromyin 250 mg: two tables now and then one tablet daily for 4 additonal days   In addition you may use A non-prescription cough medication called Mucinex DM: take 2 tablets every 12 hours. and A prescription cough medication called Tessalon Perles . You may take 1-2 capsules every 8 hours as needed for your cough.    HOME CARE . Only take medications as instructed by your medical team. . Complete the entire course of an antibiotic. . Drink plenty of fluids and get plenty of rest. . Avoid close contacts especially the very young and the elderly . Cover your mouth if you cough or cough into your sleeve. . Always remember to wash your hands . A steam or ultrasonic humidifier can help congestion.    GET HELP RIGHT AWAY IF: . You develop worsening fever. . You become short of breath . You cough up blood. . Your symptoms persist after you have completed your treatment plan MAKE SURE YOU   Understand these instructions.  Will watch your condition.  Will get help right away if you are not doing well or  get worse.  Your e-visit answers were reviewed by a board certified advanced clinical practitioner to complete your personal care plan.  Depending on the condition, your plan could have included both over the counter or prescription medications. If there is a problem please reply  once you have received a response from your provider. Your safety is important to us.  If you have drug allergies check your prescription carefully.    You can use MyChart to ask questions about today's visit, request a non-urgent call back, or ask for a work or school excuse for 24 hours related to this e-Visit. If  it has been greater than 24 hours you will need to follow up with your provider, or enter a new e-Visit to address those concerns. You will get an e-mail in the next two days asking about your experience.  I hope that your e-visit has been valuable and will speed your recovery. Thank you for using e-visits.

## 2015-06-01 NOTE — Addendum Note (Signed)
Addended by: Beau FannyWITHROW, Zaria Taha C on: 06/01/2015 10:31 AM   Modules accepted: Orders

## 2015-06-16 DIAGNOSIS — Z3040 Encounter for surveillance of contraceptives, unspecified: Secondary | ICD-10-CM | POA: Diagnosis not present

## 2015-06-16 DIAGNOSIS — Z6829 Body mass index (BMI) 29.0-29.9, adult: Secondary | ICD-10-CM | POA: Diagnosis not present

## 2015-06-16 DIAGNOSIS — Z01419 Encounter for gynecological examination (general) (routine) without abnormal findings: Secondary | ICD-10-CM | POA: Diagnosis not present

## 2015-07-01 MED FILL — ASHLYNA 0.15-0.03-0.01 MG T: 0.15-0.03 & | 90 days supply | Qty: 91 | Fill #0

## 2015-09-29 MED FILL — ASHLYNA 0.15-0.03-0.01 MG T: 0.15-0.03 & | 90 days supply | Qty: 91 | Fill #1

## 2015-10-11 DIAGNOSIS — I2699 Other pulmonary embolism without acute cor pulmonale: Secondary | ICD-10-CM

## 2015-10-11 HISTORY — DX: Other pulmonary embolism without acute cor pulmonale: I26.99

## 2015-10-18 ENCOUNTER — Telehealth: Payer: 59 | Admitting: Nurse Practitioner

## 2015-10-18 DIAGNOSIS — J0101 Acute recurrent maxillary sinusitis: Secondary | ICD-10-CM | POA: Diagnosis not present

## 2015-10-18 MED ORDER — AMOXICILLIN-POT CLAVULANATE 875-125 MG PO TABS: 1.0000 | ORAL_TABLET | Freq: Two times a day (BID) | ORAL | Status: DC

## 2015-10-18 MED FILL — AMOX TR-K CLV 875-125 MG TA: 875-125 | 10 days supply | Qty: 20 | Fill #0

## 2015-10-18 NOTE — Progress Notes (Signed)

## 2015-11-04 ENCOUNTER — Emergency Department (HOSPITAL_COMMUNITY): Payer: 59

## 2015-11-04 ENCOUNTER — Encounter: Payer: Self-pay | Admitting: Family Medicine

## 2015-11-04 ENCOUNTER — Ambulatory Visit (HOSPITAL_COMMUNITY)
Admission: RE | Admit: 2015-11-04 | Discharge: 2015-11-04 | Disposition: A | Discharge status: Home / Self Care | Payer: 59 | Source: Ambulatory Visit | Attending: Family Medicine | Admitting: Family Medicine

## 2015-11-04 ENCOUNTER — Other Ambulatory Visit: Payer: Self-pay

## 2015-11-04 ENCOUNTER — Encounter (HOSPITAL_COMMUNITY): Payer: Self-pay | Admitting: *Deleted

## 2015-11-04 ENCOUNTER — Observation Stay (HOSPITAL_COMMUNITY)
Admission: EM | Admit: 2015-11-04 | Discharge: 2015-11-06 | Disposition: A | Discharge status: Home / Self Care | Payer: 59 | Attending: Family Medicine | Admitting: Family Medicine

## 2015-11-04 ENCOUNTER — Ambulatory Visit (INDEPENDENT_AMBULATORY_CARE_PROVIDER_SITE_OTHER): Payer: 59 | Admitting: Family Medicine

## 2015-11-04 VITALS — BP 120/70 | HR 78 | Temp 98.6°F | Ht 63.0 in | Wt 180.0 lb

## 2015-11-04 DIAGNOSIS — J9601 Acute respiratory failure with hypoxia: Secondary | ICD-10-CM | POA: Diagnosis not present

## 2015-11-04 DIAGNOSIS — E669 Obesity, unspecified: Secondary | ICD-10-CM | POA: Insufficient documentation

## 2015-11-04 DIAGNOSIS — R0682 Tachypnea, not elsewhere classified: Secondary | ICD-10-CM | POA: Diagnosis not present

## 2015-11-04 DIAGNOSIS — R0602 Shortness of breath: Secondary | ICD-10-CM | POA: Insufficient documentation

## 2015-11-04 DIAGNOSIS — J9600 Acute respiratory failure, unspecified whether with hypoxia or hypercapnia: Secondary | ICD-10-CM | POA: Insufficient documentation

## 2015-11-04 DIAGNOSIS — R0902 Hypoxemia: Secondary | ICD-10-CM | POA: Diagnosis not present

## 2015-11-04 DIAGNOSIS — R000 Tachycardia, unspecified: Secondary | ICD-10-CM | POA: Insufficient documentation

## 2015-11-04 DIAGNOSIS — R0781 Pleurodynia: Secondary | ICD-10-CM | POA: Insufficient documentation

## 2015-11-04 DIAGNOSIS — R079 Chest pain, unspecified: Secondary | ICD-10-CM

## 2015-11-04 DIAGNOSIS — I2699 Other pulmonary embolism without acute cor pulmonale: Principal | ICD-10-CM | POA: Insufficient documentation

## 2015-11-04 DIAGNOSIS — I82402 Acute embolism and thrombosis of unspecified deep veins of left lower extremity: Secondary | ICD-10-CM | POA: Diagnosis not present

## 2015-11-04 DIAGNOSIS — R071 Chest pain on breathing: Secondary | ICD-10-CM | POA: Diagnosis not present

## 2015-11-04 DIAGNOSIS — Z86718 Personal history of other venous thrombosis and embolism: Secondary | ICD-10-CM | POA: Insufficient documentation

## 2015-11-04 HISTORY — DX: Other pulmonary embolism without acute cor pulmonale: I26.99

## 2015-11-04 LAB — CBC WITH DIFFERENTIAL/PLATELET
BASOS PCT: 1 %
Basophils Absolute: 0.1 10*3/uL (ref 0.0–0.1)
EOS PCT: 2 %
Eosinophils Absolute: 0.2 10*3/uL (ref 0.0–0.7)
HEMATOCRIT: 38.7 % (ref 36.0–46.0)
Hemoglobin: 12 g/dL (ref 12.0–15.0)
Lymphocytes Relative: 39 %
Lymphs Abs: 3.5 10*3/uL (ref 0.7–4.0)
MCH: 24 pg — ABNORMAL LOW (ref 26.0–34.0)
MCHC: 31 g/dL (ref 30.0–36.0)
MCV: 77.2 fL — ABNORMAL LOW (ref 78.0–100.0)
MONO ABS: 1.2 10*3/uL — AB (ref 0.1–1.0)
Monocytes Relative: 14 %
Neutro Abs: 3.9 10*3/uL (ref 1.7–7.7)
Neutrophils Relative %: 44 %
Platelets: 259 10*3/uL (ref 150–400)
RBC: 5.01 MIL/uL (ref 3.87–5.11)
RDW: 15.2 % (ref 11.5–15.5)
WBC: 8.9 10*3/uL (ref 4.0–10.5)

## 2015-11-04 LAB — I-STAT CHEM 8, ED
BUN: 9 mg/dL (ref 6–20)
Calcium, Ion: 1.09 mmol/L — ABNORMAL LOW (ref 1.12–1.23)
Chloride: 105 mmol/L (ref 101–111)
Creatinine, Ser: 0.7 mg/dL (ref 0.44–1.00)
GLUCOSE: 88 mg/dL (ref 65–99)
HCT: 40 % (ref 36.0–46.0)
Hemoglobin: 13.6 g/dL (ref 12.0–15.0)
Potassium: 4.3 mmol/L (ref 3.5–5.1)
Sodium: 139 mmol/L (ref 135–145)
TCO2: 23 mmol/L (ref 0–100)

## 2015-11-04 LAB — MRSA PCR SCREENING: MRSA by PCR: NEGATIVE

## 2015-11-04 LAB — I-STAT TROPONIN, ED: Troponin i, poc: 0 ng/mL (ref 0.00–0.08)

## 2015-11-04 LAB — I-STAT BETA HCG BLOOD, ED (MC, WL, AP ONLY)

## 2015-11-04 MED ORDER — HEPARIN (PORCINE) IN NACL 100-0.45 UNIT/ML-% IJ SOLN
1250.0000 [IU]/h | INTRAMUSCULAR | Status: AC
  Administered 2015-11-04: 1250 [IU]/h via INTRAVENOUS
  Filled 2015-11-04: qty 250

## 2015-11-04 MED ORDER — TRAMADOL HCL 50 MG PO TABS: 50.0000 mg | ORAL_TABLET | Freq: Four times a day (QID) | ORAL | Status: DC | PRN

## 2015-11-04 MED ORDER — MORPHINE SULFATE (PF) 2 MG/ML IV SOLN
2.0000 mg | INTRAVENOUS | Status: DC | PRN
  Administered 2015-11-04 – 2015-11-05 (×4): 2 mg via INTRAVENOUS
  Filled 2015-11-04 (×5): qty 1

## 2015-11-04 MED ORDER — FENTANYL CITRATE (PF) 100 MCG/2ML IJ SOLN
50.0000 ug | Freq: Once | INTRAMUSCULAR | Status: AC
  Administered 2015-11-04: 50 ug via INTRAVENOUS
  Filled 2015-11-04: qty 2

## 2015-11-04 MED ORDER — POLYETHYLENE GLYCOL 3350 17 G PO PACK: 17.0000 g | PACK | Freq: Every day | ORAL | Status: DC | PRN

## 2015-11-04 MED ORDER — MORPHINE SULFATE (PF) 4 MG/ML IV SOLN
4.0000 mg | Freq: Once | INTRAVENOUS | Status: AC
  Administered 2015-11-04: 4 mg via INTRAVENOUS
  Filled 2015-11-04: qty 1

## 2015-11-04 MED ORDER — HEPARIN BOLUS VIA INFUSION
4500.0000 [IU] | Freq: Once | INTRAVENOUS | Status: AC
  Administered 2015-11-04: 4500 [IU] via INTRAVENOUS
  Filled 2015-11-04: qty 4500

## 2015-11-04 MED ORDER — MORPHINE SULFATE (PF) 4 MG/ML IV SOLN
4.0000 mg | INTRAVENOUS | Status: AC
  Administered 2015-11-04: 4 mg via INTRAVENOUS

## 2015-11-04 MED ORDER — ONDANSETRON HCL 4 MG/2ML IJ SOLN
4.0000 mg | Freq: Once | INTRAMUSCULAR | Status: AC
  Administered 2015-11-04: 4 mg via INTRAVENOUS
  Filled 2015-11-04: qty 2

## 2015-11-04 MED ORDER — RIVAROXABAN 15 MG PO TABS
15.0000 mg | ORAL_TABLET | Freq: Two times a day (BID) | ORAL | Status: DC
  Administered 2015-11-04 – 2015-11-06 (×4): 15 mg via ORAL
  Filled 2015-11-04 (×4): qty 1

## 2015-11-04 MED ORDER — MORPHINE SULFATE (PF) 4 MG/ML IV SOLN
4.0000 mg | INTRAVENOUS | Status: DC | PRN
  Administered 2015-11-04: 4 mg via INTRAVENOUS
  Filled 2015-11-04: qty 1

## 2015-11-04 MED ORDER — RIVAROXABAN 20 MG PO TABS: 20.0000 mg | ORAL_TABLET | Freq: Every day | ORAL | Status: DC

## 2015-11-04 MED ORDER — ONDANSETRON HCL 4 MG/2ML IJ SOLN
4.0000 mg | INTRAMUSCULAR | Status: DC | PRN
  Administered 2015-11-04 – 2015-11-06 (×5): 4 mg via INTRAVENOUS
  Filled 2015-11-04 (×5): qty 2

## 2015-11-04 MED ORDER — SODIUM CHLORIDE 0.9% FLUSH
3.0000 mL | Freq: Two times a day (BID) | INTRAVENOUS | Status: DC
  Administered 2015-11-04 – 2015-11-05 (×4): 3 mL via INTRAVENOUS

## 2015-11-04 MED ORDER — ONDANSETRON HCL 4 MG/2ML IJ SOLN: 4.0000 mg | Freq: Four times a day (QID) | INTRAMUSCULAR | Status: DC | PRN

## 2015-11-04 MED ORDER — IOPAMIDOL (ISOVUE-370) INJECTION 76%
INTRAVENOUS | Status: AC
  Administered 2015-11-04: 60 mL via INTRAVENOUS
  Filled 2015-11-04: qty 100

## 2015-11-04 NOTE — Consult Note (Signed)
PULMONARY / CRITICAL CARE MEDICINE   Name: Krystal Kim MRN: [id number] DOB: 03-08-1976    ADMISSION DATE:  11/04/2015 CONSULTATION DATE:  11/04/15  REFERRING MD: Dr. McDiarmid  CHIEF COMPLAINT:  Shortness of breath and chest pain  HISTORY OF PRESENT ILLNESS:   This is a 40 y.o. female with a past medical history of Anemia, Seasonal Allergies, and Obesity.  She presents to the ER on 11/04/2015 from her PCP with c/o cough, chills, shortness of breath, and 6/10 chest pain with radiation to the back and into the left arm onset 11/03/2015.  She states she started having pain with slight swelling in her left leg onset last week that resolved on 10/31/2015 . She denies taking any medication to relieve symptoms.  She has been taking  Levonorgestrel-ethinyl estradiol (Seasonale) for over 1 year.  She has a sedentary job and normally sits on average over 8 hrs at work.  She denies fever, vomiting, hemoptysis, recent travel, long car ride, blood clot hx, no family hx of blood clots, or smoking hx.   PCCM consulted on 11/03/2013  for Acute hypoxic respiratory failure secondary to Pulmonary Embolism  PAST MEDICAL HISTORY :  She  has a past medical history of Anemia; Seasonal allergies; Obesity; and PONV (postoperative nausea and vomiting).  PAST SURGICAL HISTORY: She  has past surgical history that includes Cesarean section; Vagina reconstruction surgery; and Cesarean section (N/A, 01/19/2014).  Allergies  Allergen Reactions  . Codeine     REACTION: Nausea and vomiting    No current facility-administered medications on file prior to encounter.   Current Outpatient Prescriptions on File Prior to Encounter  Medication Sig  . amoxicillin-clavulanate (AUGMENTIN) 875-125 MG tablet Take 1 tablet by mouth 2 (two) times daily. (Patient not taking: Reported on 11/04/2015)  . azithromycin (ZITHROMAX) 250 MG tablet Take 2 tablets now then 1 daily times 4 days. (Patient not taking: Reported on 11/04/2015)  .  benzonatate (TESSALON) 100 MG capsule Take 1-2 capsules (100-200 mg total) by mouth every 8 (eight) hours as needed for cough. (Patient not taking: Reported on 11/04/2015)  . ferrous sulfate 325 (65 FE) MG tablet Take 1 tablet (325 mg total) by mouth daily with breakfast. (Patient not taking: Reported on 11/04/2015)  . fluticasone (FLONASE) 50 MCG/ACT nasal spray Place 1 spray into both nostrils 2 (two) times daily. (Patient not taking: Reported on 11/04/2015)  . ibuprofen (ADVIL,MOTRIN) 600 MG tablet Take 1 tablet (600 mg total) by mouth every 6 (six) hours as needed. (Patient not taking: Reported on 11/04/2015)  . levonorgestrel-ethinyl estradiol (SEASONALE) 0.15-0.03 MG tablet Take 1 tablet by mouth daily. (Patient not taking: Reported on 11/04/2015)  . traMADol (ULTRAM) 50 MG tablet Take 2 tablets (100 mg total) by mouth every 6 (six) hours as needed for severe pain. (Patient not taking: Reported on 11/04/2015)    FAMILY HISTORY:  Her indicated that her mother is alive. She indicated that her father is alive.   SOCIAL HISTORY: She  reports that she has never smoked. She has never used smokeless tobacco. She reports that she does not drink alcohol or use illicit drugs.  REVIEW OF SYSTEMS:   See HPI  SUBJECTIVE:  Pt currently on 2L O2 with O2 sats mid 90's.  States her breathing has improved since she received morphine. She still c/o pain with inspiration and intermittent nausea.  VITAL SIGNS: BP 116/74 mmHg  Pulse 112  Temp(Src) 98.2 F (36.8 C) (Oral)  Resp 35  Ht  (1.6  m)  Wt 180 lb (81.647 kg)  BMI 31.89 kg/m2  SpO2 100%  HEMODYNAMICS:    VENTILATOR SETTINGS:    INTAKE / OUTPUT:    PHYSICAL EXAMINATION: General:  Well developed and well nourished  Neuro:  Alert and oriented  HEENT: supple, no JVD Cardiovascular:  Sinus tach, s1s2, RRR Lungs: diminished bilateral bases, mild tachypnea, no edema   Abdomen:  +BS X4, non tender, non distended Musculoskeletal:  Moves  all extremities, normal bulk Skin:  Intact   LABS:  BMET  Recent Labs Lab 11/04/15 1022  NA 139  K 4.3  CL 105  BUN 9  CREATININE 0.70  GLUCOSE 88    Electrolytes No results for input(s): CALCIUM, MG, PHOS in the last 168 hours.  CBC  Recent Labs Lab 11/04/15 1010 11/04/15 1022  WBC 8.9  --   HGB 12.0 13.6  HCT 38.7 40.0  PLT 259  --     Coag's No results for input(s): APTT, INR in the last 168 hours.  Sepsis Markers No results for input(s): LATICACIDVEN, PROCALCITON, O2SATVEN in the last 168 hours.  ABG No results for input(s): PHART, PCO2ART, PO2ART in the last 168 hours.  Liver Enzymes No results for input(s): AST, ALT, ALKPHOS, BILITOT, ALBUMIN in the last 168 hours.  Cardiac Enzymes No results for input(s): TROPONINI, PROBNP in the last 168 hours.  Glucose No results for input(s): GLUCAP in the last 168 hours.  Imaging Ct Angio Chest Pe W/cm &/or Wo Cm  11/04/2015  CLINICAL DATA:  LEFT upper anterior chest pain and LEFT arm pain since last night, shortness of breath, initial encounter EXAM: CT ANGIOGRAPHY CHEST WITH CONTRAST TECHNIQUE: Multidetector CT imaging of the chest was performed using the standard protocol during bolus administration of intravenous contrast. Multiplanar CT image reconstructions and MIPs were obtained to evaluate the vascular anatomy. CONTRAST:  60 cc Isovue 370 IV COMPARISON:  None FINDINGS: Vascular: Aorta normal caliber without aneurysm or dissection. Pulmonary arteries well opacified. Filling defects identified in BILATERAL lower lobe pulmonary arteries consistent with pulmonary emboli. No RIGHT ventricular dilatation. RV/LV ratio = 0.84 Mediastinum/Lymph Nodes: Unremarkable esophagus. No and mediastinal adenopathy. Visualize inferior cervical region normal appearance. Lungs/Pleura: Bibasilar atelectasis. Lungs otherwise clear. No pleural effusion or pneumothorax. Upper abdomen: Normal appearance Musculoskeletal: Osseous structures  unremarkable. Review of the MIP images confirms the above findings. IMPRESSION: Small filling defects identified within BILATERAL lower lobe pulmonary arteries compatible with pulmonary embolism. Critical Value/emergent results were called by telephone at the time of interpretation on 11/04/2015 at 11:38 am to Dr. Benjiman Core , who verbally acknowledged these results. Electronically Signed   By: Ulyses Southward M.D.   On: 11/04/2015 11:40     STUDIES:  Ct agio Chest 5/25>>Small filling defects identified within BILATERAL lower lobe pulmonary arteries compatible with pulmonary embolism  CULTURES: MRSA PCR 5/25>>  ANTIBIOTICS: none  SIGNIFICANT EVENTS: 5/25-Pt admitted to stepdown unit due to pulmonary embolism  LINES/TUBES: none  DISCUSSION: This is a 40 y.o. female with a past medical history of Anemia, Seasonal Allergies, and Obesity.  She presents to the ER on 11/04/2015 from her PCP with c/o cough, chills, shortness of breath, and 6/10 chest pain with radiation to the back and into the left arm onset 11/03/2015. PCCM consulted on 11/03/2013  for Acute hypoxic respiratory failure secondary to Pulmonary Embolism    ASSESSMENT Acute hypoxic respiratory failure secondary to Bilateral Pulmonary Embolism (takes estrogen containing oral contraception and has a sedentary job)   Higher education careers adviser  Continue Heparin drip per pharmacy  Ultrasound bilateral lower extremities to r/o DVT Supplemental O2 as needed to maintain O2 sats 92% or above Serial troponin's Echo Bedrest Prn Morphine as needed for pain Prn Zofran as needed for nausea/vomiting   FAMILY  - Updates:   - Inter-disciplinary family meet or Palliative Care meeting due by:  day 7   Sonda Rumbleana Blakeney, AGNP  Pulmonary/Critical Care   11/04/2015, 1:21 PM  Attending Note:  40 year old female who is on birth control and a sedentary job who comes in with chest pain.  CT was done that I reviewed myself showed PE.  On exam, lungs are clear.   CP present.  Discussed with EDP and PCCM-NP.  PE:  - Heparin.  - Decide on PO agent for anti-coag.  - Lower ext doppler.  - No need for filter or lytics.  Chest Pain:  - Morphine for pain control.  - IS to prevent atelectasis and stenting as patient is breathing rapidly and shallow.  - CXR in AM.  Hypoxemia:  - Titrate O2 for sat of 92-95%.  - May need an ambulatory desat study prior to discharge.  Sinus tachycardia: due to pain.  - Morphine.  Tachypnea: due to pain.  - Morphine as ordered.  PCCM will sign off, please call back if needed.  Patient seen and examined, agree with above note.  I dictated the care and orders written for this patient under my direction.  Alyson ReedyWesam G Yacoub, MD [phone]

## 2015-11-04 NOTE — Discharge Instructions (Addendum)
You were hospitalized because you had blood clots in your lungs. We think this may be related to your birth control pills, so we recommend that you stop taking these medications. It will be important for you to remember that you cannot take any medications with estrogen in the future.  To help your blood clots resolve, we have started you on a medication called Xarelto. Please take Xarelto 15 mg twice daily for 21 days (until June 15th), followed by Xarelto 20 mg daily. Below is some additional information about Xarelto.   Information on my medicine - XARELTO (rivaroxaban)  This medication education was reviewed with me or my healthcare representative as part of my discharge preparation.  The pharmacist that spoke with me during my hospital stay was:  Sampson SiMancheril, Benjamin G, [facility]  WHY WAS XARELTO PRESCRIBED FOR YOU? Xarelto was prescribed to treat blood clots that may have been found in the veins of your legs (deep vein thrombosis) or in your lungs (pulmonary embolism) and to reduce the risk of them occurring again.  What do you need to know about Xarelto? The starting dose is one 15 mg tablet taken TWICE daily with food for the FIRST 21 DAYS then on 11/26/2015  the dose is changed to one 20 mg tablet taken ONCE A DAY with your evening meal.  DO NOT stop taking Xarelto without talking to the health care provider who prescribed the medication.  Refill your prescription for 20 mg tablets before you run out.  After discharge, you should have regular check-up appointments with your healthcare provider that is prescribing your Xarelto.  In the future your dose may need to be changed if your kidney function changes by a significant amount.  What do you do if you miss a dose? If you are taking Xarelto TWICE DAILY and you miss a dose, take it as soon as you remember. You may take two 15 mg tablets (total 30 mg) at the same time then resume your regularly scheduled 15 mg twice daily the next  day.  If you are taking Xarelto ONCE DAILY and you miss a dose, take it as soon as you remember on the same day then continue your regularly scheduled once daily regimen the next day. Do not take two doses of Xarelto at the same time.   Important Safety Information Xarelto is a blood thinner medicine that can cause bleeding. You should call your healthcare provider right away if you experience any of the following: ? Bleeding from an injury or your nose that does not stop. ? Unusual colored urine (red or dark brown) or unusual colored stools (red or black). ? Unusual bruising for unknown reasons. ? A serious fall or if you hit your head (even if there is no bleeding).  Some medicines may interact with Xarelto and might increase your risk of bleeding while on Xarelto. To help avoid this, consult your healthcare provider or pharmacist prior to using any new prescription or non-prescription medications, including herbals, vitamins, non-steroidal anti-inflammatory drugs (NSAIDs) and supplements.  This website has more information on Xarelto: [email or web address].xarelto.[email or web address].

## 2015-11-04 NOTE — Progress Notes (Signed)
ANTICOAGULATION CONSULT NOTE - Initial Consult  Pharmacy Consult for heparin to Xarelto  Indication: pulmonary embolus  Allergies  Allergen Reactions  . Codeine     REACTION: Nausea and vomiting    Patient Measurements: Height: 5\' 3"  (160 cm) Weight: 180 lb (81.647 kg) IBW/kg (Calculated) : 52.4 Heparin Dosing Weight: 70.3 kg  Vital Signs: Temp: 98.2 F (36.8 C) (05/25 1002) Temp Source: Oral (05/25 1002) BP: 116/74 mmHg (05/25 1230) Pulse Rate: 112 (05/25 1230)  Labs:  Recent Labs  11/04/15 1010 11/04/15 1022  HGB 12.0 13.6  HCT 38.7 40.0  PLT 259  --   CREATININE  --  0.70    Estimated Creatinine Clearance: 95.5 mL/min (by C-G formula based on Cr of 0.7).   Medical History: Past Medical History  Diagnosis Date  . Anemia   . Seasonal allergies   . Obesity   . PONV (postoperative nausea and vomiting)   . PE (pulmonary embolism) 10/2015    Assessment: 39 yof with bilateral PE, RV/LV ratio 0.84. Currently on IV heparin. Pharmacy consulted to transition to Xarelto. CBC wnl.   Goal of Therapy:  Heparin level 0.3-0.7 units/ml Monitor platelets by anticoagulation protocol: Yes   Plan:  Stop IV heparin at 1700 today  Start Xarelto 15 mg twice daily x 21 days followed by Xarelto 20 mg daily. Start today after heparin is stopped.  Educate patient on Xarelto   Vinnie LevelBenjamin Deseree Zemaitis, PharmD., BCPS Clinical Pharmacist Pager [phone]

## 2015-11-04 NOTE — Assessment & Plan Note (Addendum)
Pleuritic chest pain, SOB, tachycardia, Q3T3, following L calf swelling/pain. Very suspicious for PE. Only risk factor is estrogen-containing oral contraception - send to ED via ambulance for emergent CTA and further evaluation

## 2015-11-04 NOTE — Addendum Note (Signed)
Addended by: Abram SanderADAMO, Stavroula Rohde M on: 11/04/2015 02:52 PM   Modules accepted: Level of Service

## 2015-11-04 NOTE — ED Notes (Signed)
PCM at bedside

## 2015-11-04 NOTE — H&P (Signed)
Family Medicine Teaching [facility] Admission History and Physical Service Pager: [phone]  Patient name: Krystal Kim Medical record number: [id number] Date of birth: 1975-07-15 Age: 40 y.o. Gender: female  Primary Care Provider: [name], MD Consultants: Pulmonology Code Status: Full (per discussion on admission)   Chief Complaint: shortness of breath   Assessment and Plan: OLINE BELK is a 40 y.o. female presenting with dyspnea and pleuritic chest pain. PMH is significant for Obesity and Anemia, Estrogen Contraception.  Bilateral Pulmonary Embolisms: Hx of DVT like symptoms in LLE (swelling and pain) on 5/15 that resolved a few days later. Dyspnea and pleuritic chest pain x 1 day. CTA showing small filling defects identified within bilateral lower lobe pulmonary arteries compatible with pulmonary embolism, RV/LV ratio 0.84, no RV strain. Vitals stable but has increased respiratory rate into the 30's and tachycardia (106-112). Is on supplemental O2 only for comfort, her O2 saturation was in the 90's on room air. 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 No personal or family hx of coagulopathy.   - Admit to SDU, attend Dr. McDiarmid - Vital signs per floor protocol - Pulmonolgy consulted in the ED, appreciate their assistance - Continuous pulse ox - Supplemental oxygen should be administered to target an oxygen saturation ?92 % - Heparin drip per pharmacy, go ahead and start DOAC. Transition off of Heparin after starting. - Pain control: Morphine 2 mg q 4 PRN - Continuous cardiac monitoring - IS - CCM ordered Echo, BL LE Venous Duplex, AM CXR.  FEN/GI: SLIV, regular diet.  Prophylaxis: Heparin drip per pharm  Disposition: Admit to SDU, attending Dr. McDiarmid  History of Present Illness:  Krystal Kim is a 40 y.o. female presenting with dyspnea and chest pain  Patient states that last Monday she developed  swelling and tenderness in her left calf. She took some Tylenol and it resolved after a few days. Last night patient woke up feeling short of breath and had pleuritic chest pain. Additionally she has been coughing. She decided to go to her PCP's office where they were concerned and told her to go to the ED. She denies any hemoptysis. Admits to some nausea. Has a sendentary office job and takes OCPs but other than that, no risk factors for blood clots (no personal or family hx of blood clots, no recent travel, no smoking).     Review Of Systems: Per HPI with the following additions:  Otherwise the remainder of the systems were negative.  Patient Active Problem List   Diagnosis Date Noted  . Chest pain 11/04/2015  . Pulmonary embolism (HCC) 11/04/2015  . Anemia 01/22/2014  . Obesity 04/21/2011    Past Medical History: Past Medical History  Diagnosis Date  . Anemia   . Seasonal allergies   . Obesity   . PONV (postoperative nausea and vomiting)     Past Surgical History: Past Surgical History  Procedure Laterality Date  . Cesarean section    . Vagina reconstruction surgery    . Cesarean section N/A 01/19/2014    Procedure: REPEAT CESAREAN SECTION;  Surgeon: Esmeralda Arthur, MD;  Location: [facility];  Service: Obstetrics;  Laterality: N/A;    Social History: Social History  Substance Use Topics  . Smoking status: Never Smoker   . Smokeless tobacco: Never Used  . Alcohol Use: No   Additional social history: None Please also refer to relevant sections of EMR.  Family History: Family History  Problem Relation Age of Onset  . Diabetes Father   . Hypertension Son   . Diabetes Maternal Aunt   . Heart disease Maternal Grandmother     Allergies and Medications: Allergies  Allergen Reactions  . Codeine     REACTION: Nausea and vomiting   No current facility-administered medications on file prior to encounter.   Current Outpatient Prescriptions on File Prior to Encounter   Medication Sig Dispense Refill  . amoxicillin-clavulanate (AUGMENTIN) 875-125 MG tablet Take 1 tablet by mouth 2 (two) times daily. (Patient not taking: Reported on 11/04/2015) 20 tablet 0  . azithromycin (ZITHROMAX) 250 MG tablet Take 2 tablets now then 1 daily times 4 days. (Patient not taking: Reported on 11/04/2015) 6 tablet 0  . benzonatate (TESSALON) 100 MG capsule Take 1-2 capsules (100-200 mg total) by mouth every 8 (eight) hours as needed for cough. (Patient not taking: Reported on 11/04/2015) 30 capsule 0  . ferrous sulfate 325 (65 FE) MG tablet Take 1 tablet (325 mg total) by mouth daily with breakfast. (Patient not taking: Reported on 11/04/2015) 30 tablet 2  . fluticasone (FLONASE) 50 MCG/ACT nasal spray Place 1 spray into both nostrils 2 (two) times daily. (Patient not taking: Reported on 11/04/2015) 16 g 6  . ibuprofen (ADVIL,MOTRIN) 600 MG tablet Take 1 tablet (600 mg total) by mouth every 6 (six) hours as needed. (Patient not taking: Reported on 11/04/2015) 36 tablet 2  . levonorgestrel-ethinyl estradiol (SEASONALE) 0.15-0.03 MG tablet Take 1 tablet by mouth daily. (Patient not taking: Reported on 11/04/2015) 1 Package 4  . traMADol (ULTRAM) 50 MG tablet Take 2 tablets (100 mg total) by mouth every 6 (six) hours as needed for severe pain. (Patient not taking: Reported on 11/04/2015) 30 tablet 2    Objective: BP 116/74 mmHg  Pulse 112  Temp(Src) 98.2 F (36.8 C) (Oral)  Resp 35  Ht 5\' 3"  (1.6 m)  Wt 180 lb (81.647 kg)  BMI 31.89 kg/m2  SpO2 100% Exam: General: In NAD, sitting up in bed, pleasant Eyes: PERRLA, non icteric sclera ENTM: moist mucous membranes, no pharyngeal erythema Neck: supple, no lymphadenopathy Cardiovascular: RRR, no murmurs Respiratory: increased respiratory rate, can speak in full sentences. Nasal canula in place on 1 L O2. Clear to auscultation bilaterally Abdomen: soft, non distended, non tender, normal bowel sound MSK: 5/5 strength in bilateral UE and  LE. Negative Homan's sign bilaterally. Calves non tender and equal in size. Left anterior and posterior chest wall tender to palpation Skin: no rashes, some pink hyperpigmentation of anterior lower extremities Neuro: Alert and oriented x3, CN 2-12 intact, sensation intact throughout Psych: normal mood and affect  Labs and Imaging: CBC BMET   Recent Labs Lab 11/04/15 1010 11/04/15 1022  WBC 8.9  --   HGB 12.0 13.6  HCT 38.7 40.0  PLT 259  --     Recent Labs Lab 11/04/15 1022  NA 139  K 4.3  CL 105  BUN 9  CREATININE 0.70  GLUCOSE 88     Ct Angio Chest Pe W/cm &/or Wo Cm  11/04/2015  CLINICAL DATA:  LEFT upper anterior chest pain and LEFT arm pain since last night, shortness of breath, initial encounter EXAM: CT ANGIOGRAPHY CHEST WITH CONTRAST TECHNIQUE: Multidetector CT imaging of the chest was performed using the standard protocol during bolus administration of intravenous contrast. Multiplanar CT image reconstructions and MIPs were obtained to evaluate the vascular anatomy. CONTRAST:  60 cc Isovue 370 IV COMPARISON:  None FINDINGS: Vascular:  Aorta normal caliber without aneurysm or dissection. Pulmonary arteries well opacified. Filling defects identified in BILATERAL lower lobe pulmonary arteries consistent with pulmonary emboli. No RIGHT ventricular dilatation. RV/LV ratio = 0.84 Mediastinum/Lymph Nodes: Unremarkable esophagus. No and mediastinal adenopathy. Visualize inferior cervical region normal appearance. Lungs/Pleura: Bibasilar atelectasis. Lungs otherwise clear. No pleural effusion or pneumothorax. Upper abdomen: Normal appearance Musculoskeletal: Osseous structures unremarkable. Review of the MIP images confirms the above findings. IMPRESSION: Small filling defects identified within BILATERAL lower lobe pulmonary arteries compatible with pulmonary embolism. Critical Value/emergent results were called by telephone at the time of interpretation on 11/04/2015 at 11:38 am to Dr.  Benjiman CoreNATHAN PICKERING , who verbally acknowledged these results. Electronically Signed   By: Ulyses SouthwardMark  Boles M.D.   On: 11/04/2015 11:40    Beaulah Dinninghristina M Gambino, MD 11/04/2015, 12:58 PM PGY-1, [facility] FPTS Intern pager: [phone], text pages welcome   I agree with the above evaluation, assessment, and plan. Any correctional changes can be noted in [facility].   Yolande Jollyaleb G Anistyn Graddy, MD Family Medicine Resident - PGY 2

## 2015-11-04 NOTE — ED Notes (Signed)
Pt arrives from [facility] via [facility]. Pt states she had leg pain last week that resolved Saturday. Pt states she began having SOB and a cough last night. Pt experiencing left sided CP today with radiation through the back and into the left arm. Pt states pain increases with deep inspiration and therefore she is having shallow respirations and is tachypnic. Pt is on estrogen BC and sits for longs periods daily for work. Pt is also tachycardiac upon arrival.

## 2015-11-04 NOTE — ED Provider Notes (Signed)
CSN: [id number]     Arrival date & time 11/04/15  [id number] History   First MD Initiated Contact with Patient 11/04/15 [phone]     Chief Complaint  Patient presents with  . Chest Pain      Patient is a 40 y.o. female presenting with chest pain. The history is provided by the patient.  Chest Pain Associated symptoms: shortness of breath   Associated symptoms: no abdominal pain, no back pain, no headache, no nausea, no numbness, not vomiting and no weakness   patient was sent in from urgent care to rule out pulmonary embolism with a CTA.She has hada couple day history ofupper left-sided chest pain.Some shortness of breath with it. Pain is worse with deep breath. It is dull. she is on birth control pills. She had left leg pain just over a week ago. It is improved somewhat but was severe at the time. No fevers.   Past Medical History  Diagnosis Date  . Anemia   . Seasonal allergies   . Obesity   . PONV (postoperative nausea and vomiting)    Past Surgical History  Procedure Laterality Date  . Cesarean section    . Vagina reconstruction surgery    . Cesarean section N/A 01/19/2014    Procedure: REPEAT CESAREAN SECTION;  Surgeon: Esmeralda ArthurSandra A Rivard, MD;  Location: [facility];  Service: Obstetrics;  Laterality: N/A;   Family History  Problem Relation Age of Onset  . Diabetes Father   . Hypertension Son   . Diabetes Maternal Aunt   . Heart disease Maternal Grandmother    Social History  Substance Use Topics  . Smoking status: Never Smoker   . Smokeless tobacco: Never Used  . Alcohol Use: No   OB History    Gravida Para Term Preterm AB TAB SAB Ectopic Multiple Living   4 4 1       4      Review of Systems  Constitutional: Negative for activity change and appetite change.  Eyes: Negative for pain.  Respiratory: Positive for shortness of breath. Negative for chest tightness and stridor.   Cardiovascular: Positive for chest pain and leg swelling.  Gastrointestinal: Negative for nausea,  vomiting, abdominal pain and diarrhea.  Genitourinary: Negative for flank pain.  Musculoskeletal: Negative for back pain and neck stiffness.  Skin: Negative for rash.  Neurological: Negative for weakness, numbness and headaches.  Psychiatric/Behavioral: Negative for behavioral problems.      Allergies  Codeine  Home Medications   Prior to Admission medications   Medication Sig Start Date End Date Taking? Authorizing Provider  acetaminophen (TYLENOL) 325 MG tablet Take 650 mg by mouth every 6 (six) hours as needed for mild pain.   Yes Historical Provider, MD  ASHLYNA 0.15-0.03 &0.01 MG tablet Take 1 tablet by mouth daily. 09/29/15  Yes Historical Provider, MD  amoxicillin-clavulanate (AUGMENTIN) 875-125 MG tablet Take 1 tablet by mouth 2 (two) times daily. Patient not taking: Reported on 11/04/2015 10/18/15   Mary-Margaret Daphine DeutscherMartin, FNP  azithromycin (ZITHROMAX) 250 MG tablet Take 2 tablets now then 1 daily times 4 days. Patient not taking: Reported on 11/04/2015 06/01/15   Beau FannyJohn C Withrow, FNP  benzonatate (TESSALON) 100 MG capsule Take 1-2 capsules (100-200 mg total) by mouth every 8 (eight) hours as needed for cough. Patient not taking: Reported on 11/04/2015 06/01/15   Beau FannyJohn C Withrow, FNP  ferrous sulfate 325 (65 FE) MG tablet Take 1 tablet (325 mg total) by mouth daily with breakfast. Patient not taking: Reported  on 11/04/2015 01/22/14   Nigel Bridgeman, CNM  fluticasone (FLONASE) 50 MCG/ACT nasal spray Place 1 spray into both nostrils 2 (two) times daily. Patient not taking: Reported on 11/04/2015 06/01/15   Beau Fanny, FNP  ibuprofen (ADVIL,MOTRIN) 600 MG tablet Take 1 tablet (600 mg total) by mouth every 6 (six) hours as needed. Patient not taking: Reported on 11/04/2015 01/22/14   Nigel Bridgeman, CNM  levonorgestrel-ethinyl estradiol (SEASONALE) 0.15-0.03 MG tablet Take 1 tablet by mouth daily. Patient not taking: Reported on 11/04/2015 03/15/14   Nigel Bridgeman, CNM  traMADol (ULTRAM) 50 MG  tablet Take 2 tablets (100 mg total) by mouth every 6 (six) hours as needed for severe pain. Patient not taking: Reported on 11/04/2015 01/22/14   Nigel Bridgeman, CNM   BP 119/73 mmHg  Pulse 74  Temp(Src) 98.2 F (36.8 C) (Oral)  Resp 32  Ht 5\' 3"  (1.6 m)  Wt 180 lb (81.647 kg)  BMI 31.89 kg/m2  SpO2 95% Physical Exam  Constitutional: She appears well-developed and well-nourished.  HENT:  Head: Atraumatic.  Eyes: EOM are normal.  Neck: Neck supple.  Cardiovascular:  Mild tachycardia  Pulmonary/Chest: She exhibits tenderness.  Tenderness to left upper chest wall. No crepitance or deformity. Mild tachycardia.  Abdominal: Soft.  Musculoskeletal: She exhibits tenderness.  Slight tenderness to left posterior knee area.  Skin: Skin is warm.    ED Course  Procedures (including critical care time) Labs Review Labs Reviewed  CBC WITH DIFFERENTIAL/PLATELET - Abnormal; Notable for the following:    MCV 77.2 (*)    MCH 24.0 (*)    Monocytes Absolute 1.2 (*)    All other components within normal limits  I-STAT CHEM 8, ED - Abnormal; Notable for the following:    Calcium, Ion 1.09 (*)    All other components within normal limits  HEPARIN LEVEL (UNFRACTIONATED)  I-STAT TROPOININ, ED  I-STAT BETA HCG BLOOD, ED ([facility], [facility], [facility] ONLY)    Imaging Review Ct Angio Chest Pe W/cm &/or Wo Cm  11/04/2015  CLINICAL DATA:  LEFT upper anterior chest pain and LEFT arm pain since last night, shortness of breath, initial encounter EXAM: CT ANGIOGRAPHY CHEST WITH CONTRAST TECHNIQUE: Multidetector CT imaging of the chest was performed using the standard protocol during bolus administration of intravenous contrast. Multiplanar CT image reconstructions and MIPs were obtained to evaluate the vascular anatomy. CONTRAST:  60 cc Isovue 370 IV COMPARISON:  None FINDINGS: Vascular: Aorta normal caliber without aneurysm or dissection. Pulmonary arteries well opacified. Filling defects identified in BILATERAL lower lobe  pulmonary arteries consistent with pulmonary emboli. No RIGHT ventricular dilatation. RV/LV ratio = 0.84 Mediastinum/Lymph Nodes: Unremarkable esophagus. No and mediastinal adenopathy. Visualize inferior cervical region normal appearance. Lungs/Pleura: Bibasilar atelectasis. Lungs otherwise clear. No pleural effusion or pneumothorax. Upper abdomen: Normal appearance Musculoskeletal: Osseous structures unremarkable. Review of the MIP images confirms the above findings. IMPRESSION: Small filling defects identified within BILATERAL lower lobe pulmonary arteries compatible with pulmonary embolism. Critical Value/emergent results were called by telephone at the time of interpretation on 11/04/2015 at 11:38 am to Dr. Benjiman Core , who verbally acknowledged these results. Electronically Signed   By: Ulyses Southward M.D.   On: 11/04/2015 11:40   I have personally reviewed and evaluated these images and lab results as part of my medical decision-making.   EKG Interpretation   Date/Time:  Thursday Nov 04 2015 10:02:12 EDT Ventricular Rate:  116 PR Interval:  140 QRS Duration: 77 QT Interval:  308 QTC Calculation:  428 R Axis:   52 Text Interpretation:  Sinus tachycardia Nonspecific T abnormalities,  inferior leads Baseline wander in lead(s) V4 V5 Confirmed by Rubin Payor   MD, Harrold Donath [phone]) on 11/04/2015 10:18:26 AM Also confirmed by Rubin Payor   MD, Asar Evilsizer [phone]), editor Whitney Post, Cala Bradford [phone])  on 11/04/2015 11:44:43  AM      MDM   Final diagnoses:  Other acute pulmonary embolism without acute cor pulmonale (HCC)    Patient with shortness of breath and chest pain. Found to have bilateral pulmonary embolism. Tachycardia and tachypnea without hypotension or hypoxia. CT scan did not show severely increased RV to LV ratio. Discussed with critical care, will see the patient, however patient admitted to family practice residency.  CRITICAL CARE Performed by: Billee Cashing Total critical care  time: 30 minutes Critical care time was exclusive of separately billable procedures and treating other patients. Critical care was necessary to treat or prevent imminent or life-threatening deterioration. Critical care was time spent personally by me on the following activities: development of treatment plan with patient and/or surrogate as well as nursing, discussions with consultants, evaluation of patient's response to treatment, examination of patient, obtaining history from patient or surrogate, ordering and performing treatments and interventions, ordering and review of laboratory studies, ordering and review of radiographic studies, pulse oximetry and re-evaluation of patient's condition.     Benjiman Core, MD 11/04/15 [phone]

## 2015-11-04 NOTE — Progress Notes (Signed)
   Subjective:   Krystal Kim is a 40 y.o. female with a history of estrogen-containing contraception use here for chest pain  CHEST PAIN  Chest pain began 1 days ago. Pain is: sharp, L chest and shoulder  How severe is the pain: very What worsens the pain: deep breath What makes the pain better: not breathing Radiation: shoulder, some upper back  PMH History of blood clot or heart problems or aneurysms: no  Symptoms Nausea/vomiting: no Shortness of breath: yes Pleuritic pain: yes Cough: yes Swelling of legs: yes, L calf swelling and tenderness last week, now resolved Syncope: no Heart burn or food sticking: no Immobility: no  Review of Symptoms - see HPI PMH - Smoking status noted.     Objective:  BP 120/70 mmHg  Pulse 78  Temp(Src) 98.6 F (37 C) (Oral)  Ht 5\' 3"  (1.6 m)  Wt 180 lb (81.647 kg)  BMI 31.89 kg/m2  SpO2 99%  Gen:  [age] y.o. female in NAD HEENT: NCAT, MMM, anicteric sclerae CV: RRR, no MRG, no JVD Resp: shallow breathing with increased effort, CTAB, no wheezes noted Abd: Soft, NTND, BS present, no guarding or organomegaly Ext: WWP, no edema Neuro: Alert and oriented, speech normal    EKG: sinus tachycardia, Q waves in III, inverted T waves in III   Assessment & Plan:     Krystal Kim is a 40 y.o. female here for pleuritic chest pain and SOB  Chest pain Pleuritic chest pain, SOB, tachycardia, Q3T3, following L calf swelling/pain. Very suspicious for PE. Only risk factor is estrogen-containing oral contraception - send to ED via ambulance for emergent CTA and further evaluation      Beverely LowElena Talyah Seder, MD, MPH [facility] PGY-3 11/04/2015 10:00 AM

## 2015-11-04 NOTE — ED Notes (Signed)
Pt became diaphoretic and is now complaining of CP under the left breast that feels like an immense pressure and squeezing sensation. Pt rr was 38 with shallow respirations.

## 2015-11-04 NOTE — Progress Notes (Signed)
ANTICOAGULATION CONSULT NOTE - Initial Consult  Pharmacy Consult for heparin Indication: pulmonary embolus  Allergies  Allergen Reactions  . Codeine     REACTION: Nausea and vomiting    Patient Measurements: Height: 5\' 3"  (160 cm) Weight: 180 lb (81.647 kg) IBW/kg (Calculated) : 52.4 Heparin Dosing Weight: 70.3 kg  Vital Signs: Temp: 98.2 F (36.8 C) (05/25 1002) Temp Source: Oral (05/25 1002) BP: 119/84 mmHg (05/25 1115) Pulse Rate: 115 (05/25 1115)  Labs:  Recent Labs  11/04/15 1010 11/04/15 1022  HGB 12.0 13.6  HCT 38.7 40.0  PLT 259  --   CREATININE  --  0.70    Estimated Creatinine Clearance: 95.5 mL/min (by C-G formula based on Cr of 0.7).   Medical History: Past Medical History  Diagnosis Date  . Anemia   . Seasonal allergies   . Obesity   . PONV (postoperative nausea and vomiting)     Assessment: 39 yof with bilateral PE, RV/LV ratio 0.84. Pharmacy consulted to dose heparin. CBC wnl. No bleed documented. No AC noted pta.  Goal of Therapy:  Heparin level 0.3-0.7 units/ml Monitor platelets by anticoagulation protocol: Yes   Plan:  Heparin 4500 unit bolus Start heparin at 1250 units/h 6h HL Daily HL/CBC Mon s/sx bleeding   Babs BertinHaley Symphani Eckstrom, PharmD, [facility] Clinical Pharmacist Pager [phone] 11/04/2015 11:47 AM

## 2015-11-05 ENCOUNTER — Observation Stay (HOSPITAL_COMMUNITY): Payer: 59

## 2015-11-05 ENCOUNTER — Observation Stay (HOSPITAL_BASED_OUTPATIENT_CLINIC_OR_DEPARTMENT_OTHER): Payer: 59

## 2015-11-05 DIAGNOSIS — R0602 Shortness of breath: Secondary | ICD-10-CM | POA: Diagnosis not present

## 2015-11-05 DIAGNOSIS — E669 Obesity, unspecified: Secondary | ICD-10-CM | POA: Diagnosis not present

## 2015-11-05 DIAGNOSIS — R000 Tachycardia, unspecified: Secondary | ICD-10-CM | POA: Diagnosis not present

## 2015-11-05 DIAGNOSIS — J9601 Acute respiratory failure with hypoxia: Secondary | ICD-10-CM | POA: Diagnosis not present

## 2015-11-05 DIAGNOSIS — Z86718 Personal history of other venous thrombosis and embolism: Secondary | ICD-10-CM | POA: Diagnosis not present

## 2015-11-05 DIAGNOSIS — I82402 Acute embolism and thrombosis of unspecified deep veins of left lower extremity: Secondary | ICD-10-CM | POA: Diagnosis not present

## 2015-11-05 DIAGNOSIS — J9600 Acute respiratory failure, unspecified whether with hypoxia or hypercapnia: Secondary | ICD-10-CM | POA: Diagnosis not present

## 2015-11-05 DIAGNOSIS — I2699 Other pulmonary embolism without acute cor pulmonale: Secondary | ICD-10-CM | POA: Diagnosis not present

## 2015-11-05 DIAGNOSIS — R071 Chest pain on breathing: Secondary | ICD-10-CM

## 2015-11-05 DIAGNOSIS — R0781 Pleurodynia: Secondary | ICD-10-CM

## 2015-11-05 DIAGNOSIS — J9811 Atelectasis: Secondary | ICD-10-CM | POA: Diagnosis not present

## 2015-11-05 DIAGNOSIS — R0682 Tachypnea, not elsewhere classified: Secondary | ICD-10-CM | POA: Diagnosis not present

## 2015-11-05 LAB — BASIC METABOLIC PANEL
ANION GAP: 7 (ref 5–15)
BUN: 8 mg/dL (ref 6–20)
CHLORIDE: 105 mmol/L (ref 101–111)
CO2: 25 mmol/L (ref 22–32)
CREATININE: 0.68 mg/dL (ref 0.44–1.00)
Calcium: 8.5 mg/dL — ABNORMAL LOW (ref 8.9–10.3)
GFR calc non Af Amer: >60 mL/min (ref >60)
Glucose, Bld: 88 mg/dL (ref 65–99)
POTASSIUM: 4.3 mmol/L (ref 3.5–5.1)
Sodium: 137 mmol/L (ref 135–145)

## 2015-11-05 LAB — CBC
HEMATOCRIT: 37.2 % (ref 36.0–46.0)
HEMOGLOBIN: 11.2 g/dL — AB (ref 12.0–15.0)
MCH: 23.6 pg — AB (ref 26.0–34.0)
MCHC: 30.1 g/dL (ref 30.0–36.0)
MCV: 78.5 fL (ref 78.0–100.0)
Platelets: 284 10*3/uL (ref 150–400)
RBC: 4.74 MIL/uL (ref 3.87–5.11)
RDW: 15.4 % (ref 11.5–15.5)
WBC: 7.6 10*3/uL (ref 4.0–10.5)

## 2015-11-05 LAB — ECHOCARDIOGRAM COMPLETE
Height: 63 in
WEIGHTICAEL: 2880 [oz_av]

## 2015-11-05 MED ORDER — TRAMADOL HCL 50 MG PO TABS: 50.0000 mg | ORAL_TABLET | Freq: Four times a day (QID) | ORAL | Status: DC | PRN

## 2015-11-05 MED ORDER — TRAMADOL HCL 50 MG PO TABS
50.0000 mg | ORAL_TABLET | Freq: Four times a day (QID) | ORAL | Status: DC | PRN
  Administered 2015-11-05: 50 mg via ORAL
  Filled 2015-11-05: qty 1

## 2015-11-05 MED ORDER — RIVAROXABAN 15 MG PO TABS: 15.0000 mg | ORAL_TABLET | Freq: Two times a day (BID) | ORAL | Status: DC

## 2015-11-05 MED ORDER — RIVAROXABAN 20 MG PO TABS: 20.0000 mg | ORAL_TABLET | Freq: Every day | ORAL | Status: DC

## 2015-11-05 MED ORDER — HYDROCODONE-ACETAMINOPHEN 7.5-325 MG PO TABS
1.0000 | ORAL_TABLET | ORAL | Status: DC | PRN
  Administered 2015-11-05 – 2015-11-06 (×2): 1 via ORAL
  Filled 2015-11-05 (×2): qty 1

## 2015-11-05 MED ORDER — DM-GUAIFENESIN ER 30-600 MG PO TB12
1.0000 | ORAL_TABLET | Freq: Two times a day (BID) | ORAL | Status: DC
  Filled 2015-11-05 (×4): qty 1

## 2015-11-05 NOTE — Progress Notes (Signed)
*  PRELIMINARY RESULTS* Vascular Ultrasound Lower extremity venous duplex has been completed.  Preliminary findings: DVT noted in left gastroc veins. No DVT RLE.   Verbal results given to Patty, RN  Farrel DemarkJill Eunice, RDMS, RVT  11/05/2015, 2:09 PM

## 2015-11-05 NOTE — Discharge Summary (Signed)
Family Medicine Teaching [facility] Discharge Summary  Patient name: Krystal Kim Medical record number: [id number] Date of birth: 05/28/1976 Age: 40 y.o. Gender: female Date of Admission: 11/04/2015  Date of Discharge: 11/06/2015 Admitting Physician: Leighton Roach McDiarmid, MD  Primary Care Provider: [name], MD Consultants: none  Indication for Hospitalization: chest pain/ dyspnea  Discharge Diagnoses/Problem List:  Active Problems:   Pulmonary embolism (HCC)   Acute respiratory failure (HCC)   Pulmonary embolism without acute cor pulmonale (HCC)   Shortness of breath   Pleuritic chest pain  Disposition: Discharge Home  Discharge Condition: Stable  Discharge Exam:  BP 101/63 mmHg  Pulse 81  Temp(Src) 98.2 F (36.8 C) (Oral)  Resp 18  Ht  (1.6 m)  Wt 180 lb (81.647 kg)  BMI 31.89 kg/m2  SpO2 98%  LMP 09/22/2015 Gen: awake, alert, NAD HEENT: [place]/AT, EOMI Cardio: RRR, no murmurs Pulm: slightly decreased breath sounds in bilateral bases, otherwise CTAB, normal WOB on room air GI: s/p non bloody/ non bilious vomiting, no abdominal pain, +BS MSK: no edema, No TTP to either LE, negative Homan's bilaterally, +2DP Neuro: follows commands, no focal deficits Psych: mood stable, speech normal Skin: no rashes or lesions appreciated  Brief Hospital Course:  Krystal Kim is a 39 y.o. female that presented with dyspnea and pleuritic chest pain.  PMH obesity, anemia, use of OCPs  On admission, CTA with bilateral pulmonary embolism.  RV/LV ratio 0.8 with no RV strain.  Patient's exam notable for increased RR and tachycardia.  No O2 requirement.  She was started on heparin dosed for PE.  She was transitioned fully to Xarelto the following day.  She tolerated this well.  She was kept an additional day for pain control, as she continued to have severe chest pain with exertion.  She was able to be transitioned over to oral pain medications.  She tolerated these fairly well but had  occasional associated nausea/ vomiting with medications.  She was otherwise, tolerating PO well.    CCM was consulted early in the admission, who recommended echo, b/l LE venous dopplers.  Echo showed possible mild diastolic dysfunction but no other abnormalities.  LLE doppler showed acute DVT in Left gastroc veins.  Inciting event was thought to be OCPs, obesity and sedentary job.  However, would consider obtaining hypercoag labs 2 weeks after discontinuing anticoagulation.  Discharge instructions and return precautions reviewed with patient.  She was discharged in stable condition with close outpatient follow up with her PCP.  Issues for Follow Up:  1. Compliance with Xarelto 2. Chest pain/ SOB 3. Nausea 4. Consider hypercoag labs 2 weeks after completion of Xarelto  Significant Procedures: none  Significant Labs and Imaging:   Recent Labs Lab 11/04/15 1010 11/04/15 1022 11/05/15 0228  WBC 8.9  --  7.6  HGB 12.0 13.6 11.2*  HCT 38.7 40.0 37.2  PLT 259  --  284    Recent Labs Lab 11/04/15 1022 11/05/15 0228  NA 139 137  K 4.3 4.3  CL 105 105  CO2  --  25  GLUCOSE 88 88  BUN 9 8  CREATININE 0.70 0.68  CALCIUM  --  8.5*   Echocardiogram 11/05/15:  Study Conclusions - Left ventricle: The cavity size was normal. Wall thickness was  normal. Systolic function was normal. The estimated ejection  fraction was in the range of 55% to 60%. Wall motion was normal;  there were no regional wall motion abnormalities. Impressions: - Normal  LV systolic function; probable mild diastolic dysfunction;  trace MR and TR.  LE doppler 11/05/15: acute DVT noted in left gastroc veins  Ct Angio Chest Pe W/cm &/or Wo Cm  11/04/2015  CLINICAL DATA:  LEFT upper anterior chest pain and LEFT arm pain since last night, shortness of breath, initial encounter EXAM: CT ANGIOGRAPHY CHEST WITH CONTRAST TECHNIQUE: Multidetector CT imaging of the chest was performed using the standard protocol  during bolus administration of intravenous contrast. Multiplanar CT image reconstructions and MIPs were obtained to evaluate the vascular anatomy. CONTRAST:  60 cc Isovue 370 IV COMPARISON:  None FINDINGS: Vascular: Aorta normal caliber without aneurysm or dissection. Pulmonary arteries well opacified. Filling defects identified in BILATERAL lower lobe pulmonary arteries consistent with pulmonary emboli. No RIGHT ventricular dilatation. RV/LV ratio = 0.84 Mediastinum/Lymph Nodes: Unremarkable esophagus. No and mediastinal adenopathy. Visualize inferior cervical region normal appearance. Lungs/Pleura: Bibasilar atelectasis. Lungs otherwise clear. No pleural effusion or pneumothorax. Upper abdomen: Normal appearance Musculoskeletal: Osseous structures unremarkable. Review of the MIP images confirms the above findings. IMPRESSION: Small filling defects identified within BILATERAL lower lobe pulmonary arteries compatible with pulmonary embolism. Critical Value/emergent results were called by telephone at the time of interpretation on 11/04/2015 at 11:38 am to Dr. Benjiman Core , who verbally acknowledged these results. Electronically Signed   By: Ulyses Southward M.D.   On: 11/04/2015 11:40   Dg Chest Port 1 View  11/05/2015  CLINICAL DATA:  Shortness of breath. EXAM: PORTABLE CHEST 1 VIEW COMPARISON:  CT 11/04/2015. FINDINGS: Mediastinum and hilar structures normal. Low lung volumes with mild bibasilar atelectasis. Heart size normal. Small left pleural effusion. No pneumothorax. IMPRESSION: Low lung volumes with mild bibasilar atelectasis. Electronically Signed   By: Maisie Fus  Register   On: 11/05/2015 07:00   Results/Tests Pending at Time of Discharge: none  Discharge Medications:    Medication List    STOP taking these medications        amoxicillin-clavulanate 875-125 MG tablet  Commonly known as:  AUGMENTIN     ASHLYNA 0.15-0.03 &0.01 MG tablet  Generic drug:  Levonorgestrel-Ethinyl Estradiol      azithromycin 250 MG tablet  Commonly known as:  ZITHROMAX     benzonatate 100 MG capsule  Commonly known as:  TESSALON     levonorgestrel-ethinyl estradiol 0.15-0.03 MG tablet  Commonly known as:  SEASONALE     traMADol 50 MG tablet  Commonly known as:  ULTRAM      TAKE these medications        acetaminophen 325 MG tablet  Commonly known as:  TYLENOL  Take 650 mg by mouth every 6 (six) hours as needed for mild pain.     ferrous sulfate 325 (65 FE) MG tablet  Take 1 tablet (325 mg total) by mouth daily with breakfast.     fluticasone 50 MCG/ACT nasal spray  Commonly known as:  FLONASE  Place 1 spray into both nostrils 2 (two) times daily.     HYDROcodone-acetaminophen 7.5-325 MG tablet  Commonly known as:  NORCO  Take 1 tablet by mouth every 4 (four) hours as needed for severe pain.     ibuprofen 600 MG tablet  Commonly known as:  ADVIL,MOTRIN  Take 1 tablet (600 mg total) by mouth every 6 (six) hours as needed.     ondansetron 4 MG disintegrating tablet  Commonly known as:  ZOFRAN ODT  Dissolve 1 tablet orally every 6 hours as needed for nausea/ vomiting     Rivaroxaban 15  MG Tabs tablet  Commonly known as:  XARELTO  Take 1 tablet (15 mg total) by mouth 2 (two) times daily with a meal.     rivaroxaban 20 MG Tabs tablet  Commonly known as:  XARELTO  Take 1 tablet (20 mg total) by mouth daily with supper.  Start taking on:  11/26/2015        Discharge Instructions: Please refer to Patient Instructions section of EMR for full details.  Patient was counseled important signs and symptoms that should prompt return to medical care, changes in medications, dietary instructions, activity restrictions, and follow up appointments.   Follow-Up Appointments: Follow-up Information    Follow up with [facility] On 11/09/2015.   Why:  Hospital follow-up appointment at 2:30pm with Dr. Legrand RamsNewton   Contact information:   [address] [place]  [place] [id number] [phone]      Raliegh Ipshly M Eleri Ruben, DO 11/06/2015, 8:43 AM PGY-2, [facility]

## 2015-11-05 NOTE — Progress Notes (Signed)
  Echocardiogram 2D Echocardiogram has been performed.  Krystal Kim 11/05/2015, 4:49 PM

## 2015-11-05 NOTE — Progress Notes (Addendum)
Pt ambulated in hall 18200ft with NT. NT stated pt did not drop lower than 97% RA.  After getting back to the room pt stated "I feel better" and denies having any pain at this time.

## 2015-11-05 NOTE — Consult Note (Signed)
   [facility]   11/05/2015  Krystal Kim 04/30/1976 [id number]    Came to visit [name] at bedside on behalf of [facility] Care Management program for [facility] employees/dependents with [facility] [employer] insurance. Explained the [employer] program and [name] denies the need to sign up for Link to Wellness. Left program and contact information and informed her that she will receive post hospital follow up call. Confirmed best contact number for her as [phone]. Encouraged her to follow up with her Primary Care MD with Family Medicine soon after hospital discharge. She is agreeable to this.   Raiford NobleAtika Lovie Agresta, MSN-Ed, RN,BSN [facility] Care Management Hospital Liaison [phone]

## 2015-11-05 NOTE — Progress Notes (Signed)
Just received from [facility] to room 6n24, husband at bedside

## 2015-11-05 NOTE — Progress Notes (Addendum)
**  Inteverval note**  Patient with substantial pleuritic chest pain following ambulation.  She notes that it felt similar to what she had on presentation yesterday.  She required Morphine IV for this.  She is agreeable to trying Norco as an oral alternative.  Total norco need is calculated to by 60mg  daily.  Will reduce by 25% and make available q4 hours.  Norco 7.5mg  ordered q4 prn pain.  Will dc Tramadol.  Will continue to follow up pain.  Discussed with patient that we need to get her pain controlled on a PO regimen in order for her to go home, which she expressed she wants to do today.  Discussed that I would not feel comfortable sending her home with uncontrolled pain and that it would be a reason to keep her hospitalized.  She voices good understanding.  Will plan to check in again later in the afternoon.   Ashly M. Nadine CountsGottschalk, DO PGY-2, [facility]  Update: 8:48 PM  Discussed care with RN.  She notes patient was able to shower.  She did ok.  Has had 1 dose of Norco this evening about 1 hour ago.  Additionally, LE doppler with evidence of DVT in LLE.  Echocardiogram with normal systolic function, EF 55-60%, normal wall motion.  Possible mild diastolic dysfunction.  Will continue to work on pain control.  Given late hour, will plan for discharge early tomorrow morning if patient remains stable and pain is well controlled.    Ashly M. Nadine CountsGottschalk, DO PGY-2, [facility]

## 2015-11-05 NOTE — Progress Notes (Signed)
Family Medicine Teaching Service Daily Progress Note Intern Pager: [phone]  Patient name: Krystal Kim Medical record number: [id number] Date of birth: March 14, 1976 Age: 40 y.o. Gender: female  Primary Care Provider: Hilton SinclairKaty D Kagan Mutchler, MD Consultants: Pulmonology Code Status: Full  Pt Overview and Major Events to Date:  5/25: Admitted to FMTS with bilateral PE  Assessment and Plan: Krystal Kim is a 40 y.o. female presenting with dyspnea and pleuritic chest pain. PMH is significant for Obesity and Anemia, OCP use.  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 No personal or family hx of coagulopathy. HR 82, RR 13 this morning. Repeat CXR showing mild bibasilar atelectasis. Chest pain improving - Pulmonolgy consulted, appreciate recommendations. ECHO and bilateral LE venous duplex pending. - On 2L O2 overnight. Took her off O2 this morning and her O2 sats remained at 97-98% on room air. - Transitioned from Heparin to Xarelto last night - Stopped OCPs - Pain control: Tramadol 50mg  q6hrs prn - Continuous cardiac monitoring and pulse ox - Incentive spirometry - Transfer out of stepdown unit today.  FEN/GI: SLIV, regular diet.  Prophylaxis: Xarelto  Disposition: Anticipate home today.  Subjective:  Pt states she is feeling much better this morning. She is no longer having chest pain at rest. She is still having chest pain with deep inspiration. Her shortness of breath is also improving. She has no other concerns. She would like to go home today if it is safe.  Objective: Temp:  [97.6 F (36.4 C)-98.6 F (37 C)] 97.7 F (36.5 C) (05/26 0324) Pulse Rate:  [69-121] 82 (05/26 0324) Resp:  [13-40] 13 (05/26 0324) BP: (111-141)/(64-110) 111/64 mmHg (05/26 0324) SpO2:  [94 %-100 %] 97 % (05/26 0324) Weight:  [180 lb (81.647 kg)] 180 lb (81.647 kg) (05/25 1002) Physical Exam: General: In  NAD, sitting up in bed, pleasant Eyes: EOMI, no scleral icterus ENTM: moist mucous membranes, no pharyngeal erythema Neck: supple, no lymphadenopathy Cardiovascular: RRR, no murmurs Respiratory: Normal work of breathing, can speak in full sentences. Clear to auscultation bilaterally Abdomen: soft, non distended, non tender, normal bowel sound MSK: Negative Homan's sign bilaterally. Calves non tender and equal in size. Left anterior and posterior chest wall tender to palpation Skin: no rashes, some pink hyperpigmentation of anterior lower extremities Neuro: Alert and oriented x3, CN 2-12 intact, no focal deficits. Psych: normal mood and affect  Laboratory:  Recent Labs Lab 11/04/15 1010 11/04/15 1022 11/05/15 0228  WBC 8.9  --  7.6  HGB 12.0 13.6 11.2*  HCT 38.7 40.0 37.2  PLT 259  --  284    Recent Labs Lab 11/04/15 1022 11/05/15 0228  NA 139 137  K 4.3 4.3  CL 105 105  CO2  --  25  BUN 9 8  CREATININE 0.70 0.68  CALCIUM  --  8.5*  GLUCOSE 88 88    Imaging/Diagnostic Tests: CXR: Mild bibasilar atelectasis CTA: Small filling defects in the bilateral lower lobe pulmonary arteries compatible with PE  Campbell StallKaty Dodd Marrie Chandra, MD 11/05/2015, 6:42 AM PGY-1, [facility] FPTS Intern pager: [phone], text pages welcome

## 2015-11-05 NOTE — Progress Notes (Addendum)
Pt had a coughing spell.  Afterwards pt became tearful c/o left sided chest and side pain. Pt states it hurts to take breaths. Pt rates pain 10/10.  PRN Morphine given.  MD made aware of coughing spell and orders given.  After giving morphine pt states "pain is better then it was". Will continue to monitor.

## 2015-11-06 DIAGNOSIS — R000 Tachycardia, unspecified: Secondary | ICD-10-CM | POA: Diagnosis not present

## 2015-11-06 DIAGNOSIS — I2699 Other pulmonary embolism without acute cor pulmonale: Secondary | ICD-10-CM | POA: Diagnosis not present

## 2015-11-06 DIAGNOSIS — R0781 Pleurodynia: Secondary | ICD-10-CM | POA: Diagnosis not present

## 2015-11-06 DIAGNOSIS — Z86718 Personal history of other venous thrombosis and embolism: Secondary | ICD-10-CM | POA: Diagnosis not present

## 2015-11-06 DIAGNOSIS — R071 Chest pain on breathing: Secondary | ICD-10-CM | POA: Diagnosis not present

## 2015-11-06 DIAGNOSIS — R0602 Shortness of breath: Secondary | ICD-10-CM | POA: Diagnosis not present

## 2015-11-06 DIAGNOSIS — J9601 Acute respiratory failure with hypoxia: Secondary | ICD-10-CM | POA: Diagnosis not present

## 2015-11-06 DIAGNOSIS — I82402 Acute embolism and thrombosis of unspecified deep veins of left lower extremity: Secondary | ICD-10-CM | POA: Diagnosis not present

## 2015-11-06 DIAGNOSIS — E669 Obesity, unspecified: Secondary | ICD-10-CM | POA: Diagnosis not present

## 2015-11-06 DIAGNOSIS — R0682 Tachypnea, not elsewhere classified: Secondary | ICD-10-CM | POA: Diagnosis not present

## 2015-11-06 DIAGNOSIS — J9600 Acute respiratory failure, unspecified whether with hypoxia or hypercapnia: Secondary | ICD-10-CM | POA: Diagnosis not present

## 2015-11-06 MED ORDER — ONDANSETRON 4 MG PO TBDP: ORAL_TABLET | ORAL | Status: DC

## 2015-11-06 MED ORDER — HYDROCODONE-ACETAMINOPHEN 7.5-325 MG PO TABS: 1.0000 | ORAL_TABLET | ORAL | Status: DC | PRN

## 2015-11-06 MED ORDER — RIVAROXABAN (XARELTO) VTE STARTER PACK (15 & 20 MG): ORAL_TABLET | ORAL | Status: DC

## 2015-11-06 NOTE — Progress Notes (Signed)
Pt ready for DC.  DC instructions and Rx given and explained.  Card for 30 day supply of Xarelto given to pt and explained.  Pt is to follow up on 5/30 at the family medicine clinic at 2: 30.  Pt understands.  Rx for zofran and vicodin and xarelto given to pt since [facility] weekend.

## 2015-11-06 NOTE — Care Management Note (Signed)
Case Management Note  Patient Details  Name: Krystal Kim MRN: [id number] Date of Birth: 02/13/1976  Subjective/Objective: 39 yo presents to the ER from her PCP with c/o cough, chills, SOB, and CP with radiation to the back and into the L arm onset. PCCM consulted for Acute hypoxic respiratory failure secondary to PE.             Action/Plan: RN requesting Xarelto 30 day free card   Expected Discharge Date:    11/06/15           Expected Discharge Plan:  Home/Self Care  In-House Referral:     Discharge planning Services  CM Consult  Post Acute Care Choice:    Choice offered to:     DME Arranged:    DME Agency:     HH Arranged:    HH Agency:     Status of Service:  Completed, signed off  Medicare Important Message Given:    Date Medicare IM Given:    Medicare IM give by:    Date Additional Medicare IM Given:    Additional Medicare Important Message give by:     If discussed at Long Length of Stay Meetings, dates discussed:    Additional Comments: met with pt at bedside. D/C plan is to return home. She has a [employer] employee and has commercial insurance. Provided pt with a 30 day free card and copay card with zero copay for Xarelto.  Oliveras-Aizpurua, Jeannette, RN 11/06/2015, 12:18 PM  

## 2015-11-09 ENCOUNTER — Encounter: Payer: Self-pay | Admitting: Family Medicine

## 2015-11-09 ENCOUNTER — Other Ambulatory Visit: Payer: Self-pay | Admitting: *Deleted

## 2015-11-09 ENCOUNTER — Ambulatory Visit (INDEPENDENT_AMBULATORY_CARE_PROVIDER_SITE_OTHER): Payer: 59 | Admitting: Family Medicine

## 2015-11-09 VITALS — BP 118/78 | HR 94 | Temp 98.2°F | Wt 180.0 lb

## 2015-11-09 DIAGNOSIS — N920 Excessive and frequent menstruation with regular cycle: Secondary | ICD-10-CM | POA: Diagnosis not present

## 2015-11-09 DIAGNOSIS — Z7901 Long term (current) use of anticoagulants: Secondary | ICD-10-CM | POA: Diagnosis not present

## 2015-11-09 NOTE — Patient Outreach (Signed)
[facility]) Care Management  11/09/2015  Krystal Kim 07-Oct-1975 [id number]  Subjective: Telephone call to patient's home number, spoke with patient, and HIPAA verified.   [facility] to speak with husband Krystal Kim regarding healthcare needs as needed.  Discussed [facility] Care Management transition of care follow up program and patient in agreement to receive services.   Patient states she is doing great and went back to work today.   States she also saw her primary MD today for a hospital follow up visit.   States she does not have any questions related to recent hospitalization or pharmacy.   States her MD was able to answer all of her questions. Patient has RNCM's contact information and is agreement to follow up call next week.   Patient states she will look up 24 hour Nurse Advice line number if needed and currently not able to write number down.    Objective:  Per chart review: Patient hospitalized 11/04/15 - 11/06/15 for bilateral pulmonary embolism.    Patient has a history of Oral contraceptives (OCPs) use, obesity, acute respiratory failure  and anemia.   Assessment:  Received [facility]MR Transition of care referral on 11/05/15.    Transition of care screening completed, assessed for moderate risk follow up, and patient will receive 1 additional follow call up.   Plan:  RNCM will call patient for week # 2 transition of care follow up call within 1 week.   Evely Gainey H. Gardiner Barefootooper RN, BSN, CCM [facility] Care Management [facility] Telephonic CM Phone: [phone] Fax: [phone]

## 2015-11-09 NOTE — Progress Notes (Signed)
   Subjective:   Krystal Kim is a 40 y.o. female with a history of bilateral PE without RV strain currently on Xarelto.   Here for  Chief Complaint  Patient presents with  . Follow-up   Recently hospitalized for bilateral PE provoked by combined OCP use and sedentary lifestyle. Currently on xarelto. Started her period 5/29, reports it is much heavier than typical. Using 1 super tampon every 2 hours.   Health Maintenance Due  Topic Date Due  . TETANUS/TDAP  12/01/1994  . PAP SMEAR  11/30/1996    Review of Systems:   Per HPI. All other systems reviewed and are negative expect as in HPI   PMH, PSH, Medications, Allergies, SocialHx and FHx reviewed and updated in EMR- marked as reviewed 11/09/2015   Objective:  BP 118/78 mmHg  Pulse 94  Temp(Src) 98.2 F (36.8 C) (Oral)  Wt 180 lb (81.647 kg)  SpO2 100%  LMP 11/06/2015  Gen:  40 y.o. female in NAD. Speaking in full sentences. Good eye contact HEENT: NCAT, MMM, EOMI, PERRL, anicteric sclerae.  CV: RRR, no MRG, no JVD Resp: Normal WOB. Non-labored, CTAB, no wheezes noted GI: Soft, NTND, BS present, no guarding or organomegaly Ext: WWP, no edema MSK: Intact gait. Full ROM Neuro: Alert and oriented, speech normal Pysch: Normal mood and affect.       Chemistry      Component Value Date/Time   NA 137 11/05/2015 0228   K 4.3 11/05/2015 0228   CL 105 11/05/2015 0228   CO2 25 11/05/2015 0228   BUN 8 11/05/2015 0228   CREATININE 0.68 11/05/2015 0228      Component Value Date/Time   CALCIUM 8.5* 11/05/2015 0228   ALKPHOS 114 07/05/2008 1001   AST 24 07/05/2008 1001   ALT 13 07/05/2008 1001   BILITOT 1.1 07/05/2008 1001      No results found for: HGBA1C Assessment:     Krystal Roosevelterri L Chappuis is a 40 y.o. female here for follow up hospitalization for pulmonary embolism    Plan:   #Pulmonary embolism- provoked by OCP use/immobilization  - Continue Xarelto until Aug/Sept (3 months of treatment) - Check hypercoagulable labs  2 weeks after stopping Xa inhibitor  #Menorrhagia: related to anticoagulation.  - Discussed worsening of menstrual bleeding with anticoagulation. Discussed use of progesterone only birth controls but specifically LNG- IUD for bleeding management.  - RTC for heavy menstrual bleeding that last > 7-10 days and/or fills > 1 super pad per hour and would recommend checking hgb at that time.   Federico FlakeKimberly Niles Pluma Diniz, MD, ABFM OB Fellow, Faculty Practice 11/09/2015  3:22 PM

## 2015-11-15 ENCOUNTER — Other Ambulatory Visit: Payer: Self-pay | Admitting: *Deleted

## 2015-11-15 NOTE — Patient Outreach (Addendum)
[facility]) Care Management  11/15/2015  Krystal Kim 1975-08-19 [id number]  Subjective: Telephone call to patient's home number, spoke with patient's husband, states patient not currently available at home, can reach her on mobile [phone]).   Telephone call to patient's mobile number, spoke with patient, states she is currently at work right now, and will call RNCM back later.  Patient will continue to receive [facility] Care [facility] Transition of care follow up calls.  Objective: Per chart review: Patient hospitalized 11/04/15 - 11/06/15 for bilateral pulmonary embolism. Patient has a history of Oral contraceptives (OCPs) use, obesity, acute respiratory failure and anemia.   Assessment: Received UMR Transition of care referral on 11/05/15. Transition of care screening completed, assessed for moderate risk follow up, and patient will receive 1 additional follow call up.  2nd week follow up call pending patient contact.   Plan: RNCM will call patient for 3rd outreach attempt, week # 2 transition of care follow up call within 1 week.   Tomoki Lucken H. Gardiner Barefootooper RN, BSN, CCM [facility] Care Management [facility] Telephonic CM Phone: [phone] Fax: [phone]

## 2015-11-17 ENCOUNTER — Ambulatory Visit: Payer: Self-pay | Admitting: *Deleted

## 2015-11-18 ENCOUNTER — Encounter: Payer: Self-pay | Admitting: *Deleted

## 2015-11-18 ENCOUNTER — Other Ambulatory Visit: Payer: Self-pay | Admitting: *Deleted

## 2015-11-18 NOTE — Patient Outreach (Signed)
[facility]) Care Management  11/18/2015  Valorie Roosevelterri L Geiman 10-14-1975 [id number]   Subjective: Telephone call to patient's mobile number, no answer, left HIPAA compliant voicemail message, and requested call back. Patient will continue to receive [facility] Care [facility] Transition of Care follow up.  Objective: Per chart review: Patient hospitalized 11/04/15 - 11/06/15 for bilateral pulmonary embolism. Patient has a history of Oral contraceptives (OCPs) use, obesity, acute respiratory failure and anemia.   Assessment: Received UMR Transition of care referral on 11/05/15. Transition of care screening completed, assessed for moderate risk follow up, and patient will receive 1 additional follow call up. 2nd week follow up call pending patient contact.   Plan: RNCM will send patient unsuccessful outreach letter, [facility] pamphlet, and proceed with case closure within 10 business days, if no return call from patient.    Dawnetta Copenhaver H. Gardiner Barefootooper RN, BSN, CCM [facility] Care Management [facility] Telephonic CM Phone: [phone] Fax: [phone]

## 2015-11-24 MED FILL — XARELTO 20 MG TABLET: 20 | 30 days supply | Qty: 30 | Fill #0

## 2015-11-26 ENCOUNTER — Telehealth: Payer: Self-pay | Admitting: Internal Medicine

## 2015-11-26 NOTE — Telephone Encounter (Signed)
Pt is still coughing. She has spells of several hours of persistant coughing. There is no chest pain. Should she be concerned?  What can she take?

## 2015-12-01 NOTE — Telephone Encounter (Signed)
Please let Ms. Krystal Kim know that I think it would be a good idea for her to come in to the clinic to be seen by a physician. We need to make sure she has not developed a pneumonia. Please schedule her for same day clinic. Thank you!

## 2015-12-01 NOTE — Telephone Encounter (Signed)
LM for patient to call back and schedule a same day appt.  Jazmin Hartsell,CMA

## 2015-12-06 ENCOUNTER — Encounter: Payer: Self-pay | Admitting: *Deleted

## 2015-12-06 ENCOUNTER — Encounter: Payer: Self-pay | Admitting: Family Medicine

## 2015-12-06 ENCOUNTER — Ambulatory Visit (INDEPENDENT_AMBULATORY_CARE_PROVIDER_SITE_OTHER): Payer: 59 | Admitting: Family Medicine

## 2015-12-06 ENCOUNTER — Other Ambulatory Visit: Payer: Self-pay | Admitting: *Deleted

## 2015-12-06 VITALS — BP 113/63 | HR 107 | Temp 98.4°F | Wt 190.2 lb

## 2015-12-06 DIAGNOSIS — R05 Cough: Secondary | ICD-10-CM

## 2015-12-06 DIAGNOSIS — R053 Chronic cough: Secondary | ICD-10-CM

## 2015-12-06 MED ORDER — LORATADINE 10 MG PO TABS: 10.0000 mg | ORAL_TABLET | Freq: Every day | ORAL | Status: DC

## 2015-12-06 MED ORDER — BENZONATATE 200 MG PO CAPS: 200.0000 mg | ORAL_CAPSULE | Freq: Two times a day (BID) | ORAL | Status: DC | PRN

## 2015-12-06 MED FILL — BENZONATATE 200 MG CAPSULE: 200 | 10 days supply | Qty: 20 | Fill #0

## 2015-12-06 NOTE — Patient Outreach (Addendum)
[facility] Care Management  12/06/2015  Krystal Kim [birth year] [id number]  No response from patient outreach attempts, will proceed with case closure.  Objective: Per chart review: Patient hospitalized 11/04/15 - 11/06/15 for bilateral pulmonary embolism. Patient has a history of Oral contraceptives (OCPs) use, obesity, acute respiratory failure and anemia.   Assessment: Received [facility] Transition of care referral on 11/05/15. Transition of care screening completed, assessed for moderate risk follow up, and patient will receive 1 additional follow call up. 2nd week follow up call not completed, no patient response from outreach attempts.   RNCM will proceed with case closure.    Plan: RNCM will send case closure due to unable to reach request to Lind GuestLevelda Comer at [facility].     Shloka Baldridge H. Gardiner Barefootooper RN, BSN, CCM [facility] Telephonic CM Phone: [phone] Fax: [phone]

## 2015-12-06 NOTE — Patient Instructions (Signed)
Your exam makes me suspicious for a post nasal drip vs tic cough.  I have sent in Claritin and Tessalon for your cough.  Claritin is for the post nasal drip.  Tessalon to be used as needed for break through cough.  A chest xray has also been ordered to rule out pneumonia.  I will call you with those results.  Cough, Adult Coughing is a reflex that clears your throat and your airways. Coughing helps to heal and protect your lungs. It is normal to cough occasionally, but a cough that happens with other symptoms or lasts a long time may be a sign of a condition that needs treatment. A cough may last only 2-3 weeks (acute), or it may last longer than 8 weeks (chronic). CAUSES Coughing is commonly caused by:  Breathing in substances that irritate your lungs.  A viral or bacterial respiratory infection.  Allergies.  Asthma.  Postnasal drip.  Smoking.  Acid backing up from the stomach into the esophagus (gastroesophageal reflux).  Certain medicines.  Chronic lung problems, including COPD (or rarely, lung cancer).  Other medical conditions such as heart failure. HOME CARE INSTRUCTIONS  Pay attention to any changes in your symptoms. Take these actions to help with your discomfort:  Take medicines only as told by your health care provider.  If you were prescribed an antibiotic medicine, take it as told by your health care provider. Do not stop taking the antibiotic even if you start to feel better.  Talk with your health care provider before you take a cough suppressant medicine.  Drink enough fluid to keep your urine clear or pale yellow.  If the air is dry, use a cold steam vaporizer or humidifier in your bedroom or your home to help loosen secretions.  Avoid anything that causes you to cough at work or at home.  If your cough is worse at night, try sleeping in a semi-upright position.  Avoid cigarette smoke. If you smoke, quit smoking. If you need help quitting, ask your health  care provider.  Avoid caffeine.  Avoid alcohol.  Rest as needed. SEEK MEDICAL CARE IF:   You have new symptoms.  You cough up pus.  Your cough does not get better after 2-3 weeks, or your cough gets worse.  You cannot control your cough with suppressant medicines and you are losing sleep.  You develop pain that is getting worse or pain that is not controlled with pain medicines.  You have a fever.  You have unexplained weight loss.  You have night sweats. SEEK IMMEDIATE MEDICAL CARE IF:  You cough up blood.  You have difficulty breathing.  Your heartbeat is very fast.   This information is not intended to replace advice given to you by your health care provider. Make sure you discuss any questions you have with your health care provider.   Document Released: 11/25/2010 Document Revised: 02/17/2015 Document Reviewed: 08/05/2014 Elsevier Interactive Patient Education [employer]2016 Elsevier Inc.

## 2015-12-06 NOTE — Progress Notes (Addendum)
   Subjective: CC: persistent cough ZOX:WRUEAHPI:Krystal Kim NeuCouch is a 40 y.o. female presenting to clinic today for same day appointment. PCP: Hilton SinclairKaty D Mayo, MD Concerns today include:  1. Persistent cough Patient notes that cough started in the hospital.  She has not had a period of no cough since discharge.  She has a recent h/o PE and is on anticoagulation until Aug/Sept.  She reports compliance with Xarelto.  She reports that cough occurs up to 3 hours at random, no association with any time of day or activity.  Endorses occ post tussive emesis.  No fevers, chills, CP.  Cough is occ productive, clear phlegm.  No sick contacts.  Has had GERD symptoms in past but non recently.  Not on an ACE-I.  Social History Reviewed: non smoker. FamHx and MedHx reviewed.  Please see EMR.  ROS: Per HPI  Objective: Office vital signs reviewed. BP 113/63 mmHg  Pulse 107  Temp(Src) 98.4 F (36.9 C) (Oral)  Wt 190 lb 3.2 oz (86.274 kg)  LMP 11/06/2015 O2 saturation: 96%  Physical Examination:  General: Awake, alert, well nourished, well appearing female, No acute distress HEENT: Normal    Neck: No masses palpated. No lymphadenopathy    Ears: Tympanic membranes intact, normal light reflex, no erythema, no bulging    Eyes: PERRLA, EOMI    Nose: nasal turbinates moist, mildly edematous, clear nasal discharge    Throat: moist mucus membranes, mild oropharyngeal erythema Cardio: regular rate and rhythm, S1S2 heard, no murmurs appreciated Pulm: clear to auscultation bilaterally, no wheezes, rhonchi or rales, normal WOB on room air  Assessment/ Plan: 40 y.o. female   1. Persistent cough for 3 weeks or longer.  Although no other symptoms to suggest pneumonia at this time, given duration of cough, will r/o an atypical pneumonia.  Persistent cough not really typical after a PE.  Patient is anticoagulated and O2 sat normal, so do not suspect new PE.  More likely cough related to post nasal drip/ allergies vs tic cough.   Also has a h/o intermittent GERD symptoms in past.  Not on an ACE-I. - DG Chest 2 View; Future - benzonatate (TESSALON) 200 MG capsule; Take 1 capsule (200 mg total) by mouth 2 (two) times daily as needed for cough.  Dispense: 20 capsule; Refill: 0 - loratadine (CLARITIN) 10 MG tablet; Take 1 tablet (10 mg total) by mouth daily.  Dispense: 30 tablet; Refill: 11 - Could consider trial of PPI/ H2 blocker if no improvement with antihistamine - Return precautions reviewed - Follow up as scheduled with PCP  Precepted with Dr Beckie Busingeniola  Ramces Shomaker M Thamar Holik, DO PGY-2, [facility]

## 2015-12-31 MED FILL — XARELTO 20 MG TABLET: 20 | 30 days supply | Qty: 30 | Fill #1

## 2016-02-04 MED FILL — XARELTO 20 MG TABLET: 20 | 30 days supply | Qty: 30 | Fill #2

## 2016-02-23 DIAGNOSIS — Z30430 Encounter for insertion of intrauterine contraceptive device: Secondary | ICD-10-CM | POA: Diagnosis not present

## 2016-02-23 DIAGNOSIS — I2699 Other pulmonary embolism without acute cor pulmonale: Secondary | ICD-10-CM | POA: Diagnosis not present

## 2016-03-07 DIAGNOSIS — Z86711 Personal history of pulmonary embolism: Secondary | ICD-10-CM | POA: Diagnosis not present

## 2016-03-07 DIAGNOSIS — Z30431 Encounter for routine checking of intrauterine contraceptive device: Secondary | ICD-10-CM | POA: Diagnosis not present

## 2016-07-03 ENCOUNTER — Ambulatory Visit (INDEPENDENT_AMBULATORY_CARE_PROVIDER_SITE_OTHER): Payer: 59 | Admitting: Family Medicine

## 2016-07-03 ENCOUNTER — Encounter: Payer: Self-pay | Admitting: Family Medicine

## 2016-07-03 DIAGNOSIS — R609 Edema, unspecified: Secondary | ICD-10-CM | POA: Diagnosis not present

## 2016-07-03 NOTE — Patient Instructions (Signed)
Thank you for coming in today, it was so nice to see you! Today we talked about:    Leg swelling and rash. Your exam is reassuring that you don't have any blood clots. Please go to the hospital if you are having worsening swelling with leg pain, shortness of breath, or chest pain. Try wearing compression stockings if you're sitting or standing for long periods of time.   If the cream is not working for your rash after the next couple weeks, please come back and see us.   If we ordered any tests today, you will be notified via telephone of any abnormalities. If everything is normal you will get a letter in the mail.   If you have any questions or concerns, please do not hesitate to call the office at [phone]. You can also message me directly via MyChart.   Sincerely,  Krystal Simmondshristina Adriena Manfre, MD

## 2016-07-03 NOTE — Progress Notes (Signed)
   Subjective:    Patient ID: Krystal Kim , female   DOB: [birth year] , 41 y.o..   MRN: [id number]  HPI  Krystal Roosevelterri L Valin is here for  Leg swelling/edema:  Patient states that on Friday she had some right leg swelling. She notes that when she pushed on the skin on her legs there was a small indentation. She has a history of pulmonary embolism and was on Xarelto for 5-1/2 months, her husband was concerned that this could possibly be a blood clot in her leg. She is not currently on Xarelto and finished it in May 2017. She denies any leg pain, numbness, tingling, shortness of breath, chest pain, palpitations, rapid heartbeat. 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 She does not use any compression stockings or elevate her feet at the end of the day. Additionally patient has had a rash on her right lower extremity that is been present intermittently for the last couple years. She notes that she has seen a dermatologist and diagnosed with dermatitis and given a cream. She was prescribed a cream and she has just started using this cream again, she does not know the name of this cream that the cream usually takes the rash away.  Review of Systems: Per HPI. All other systems reviewed and are negative.  Social Hx:  reports that she has never smoked. She has never used smokeless tobacco.   Objective:   BP 120/68   Pulse 87   Temp 98.8 F (37.1 C) (Oral)   Ht 5\' 3"  (1.6 m)   Wt 200 lb 3.2 oz (90.8 kg)   SpO2 98%   BMI 35.46 kg/m  Physical Exam  Gen: NAD, alert, cooperative with exam, well-appearing Respiratory: Clear to auscultation bilaterally, no wheezes, non-labored breathing Gastrointestinal: soft, non tender, non distended, bowel sounds present Skin: erythematous pruritic rash throughout right shin  Neurological: no gross deficits.  Psych: good insight, normal mood and affect MSK: Bilateral lower extremities equal in size, each calf measuring 37 cm in  diameter. Tenderness with dorsiflexion or plantar flexion, negative Homans sign.  Assessment & Plan:  Edema Unclear etiology as edema is now mostly resolved in right lower extremity although leading differential includes venous stasis due to immobility desk job. Does have history of PE and was on Xarelto for 5-1/2 months but that finished in May 2017. Today Vital signs within normal limits, no tachypnea, no increased work of breathing, lungs clear to auscultation bilaterally,calf size equal on both sides without any tenderness. No signs of DVT or PE on exam. -Advised patient to use compression stockings at work -Advised propping feet up after work -Return precautions and reasons to go to the hospital discussed -Encouraged patient to continue using her cream for dermatitis on her right lower extremity   Anders Simmondshristina Gambino, MD [facility], PGY-2

## 2016-07-03 NOTE — Assessment & Plan Note (Signed)
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 Does have history of PE and was on Xarelto for 5-1/2 months but that finished in May 2017. Today Vital signs within normal limits, no tachypnea, no increased work of breathing, lungs clear to auscultation bilaterally,calf size equal on both sides without any tenderness. No signs of DVT or PE on exam. -Advised patient to use compression stockings at work -Advised propping feet up after work -Return precautions and reasons to go to the hospital discussed -Encouraged patient to continue using her cream for dermatitis on her right lower extremity

## 2016-07-07 ENCOUNTER — Telehealth: Payer: 59 | Admitting: Family

## 2016-07-07 DIAGNOSIS — B9689 Other specified bacterial agents as the cause of diseases classified elsewhere: Secondary | ICD-10-CM

## 2016-07-07 DIAGNOSIS — J329 Chronic sinusitis, unspecified: Secondary | ICD-10-CM | POA: Diagnosis not present

## 2016-07-07 MED ORDER — AMOXICILLIN-POT CLAVULANATE 875-125 MG PO TABS: 1.0000 | ORAL_TABLET | Freq: Two times a day (BID) | ORAL | 0 refills | Status: AC

## 2016-07-07 MED FILL — AMOX TR-K CLV 875-125 MG TA: 875-125 | 7 days supply | Qty: 14 | Fill #0

## 2016-07-07 NOTE — Progress Notes (Signed)

## 2017-04-06 IMAGING — CT CT ANGIO CHEST
2 of 6 series · 19 of 36 positions shown · IV contrast (Omni 300)
Comparison: None

CLINICAL DATA: LEFT upper anterior chest pain and LEFT arm pain
since last night, shortness of breath, initial encounter

EXAM:
CT ANGIOGRAPHY CHEST WITH CONTRAST
TECHNIQUE: Multidetector CT imaging of the chest was performed using the
standard protocol during bolus administration of intravenous
contrast. Multiplanar CT image reconstructions and MIPs were
obtained to evaluate the vascular anatomy.
CONTRAST:  60 cc Isovue 370 IV

[Series 6: pe thins · axial · 0.69mm/px · z∈[+1187,+1368]mm · 18 of 403 slices shown]
[im 20/403  lung]
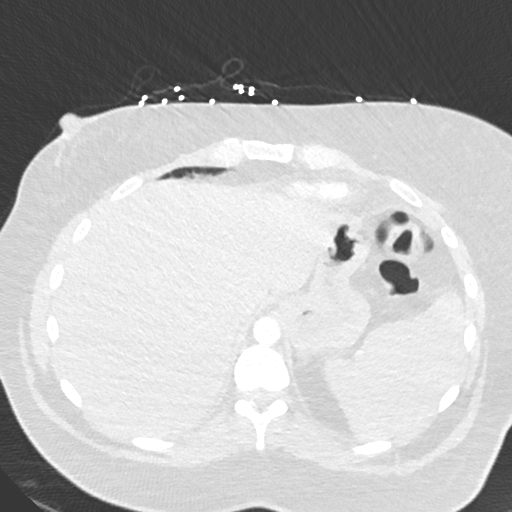
[im 39/403  mediastinal]
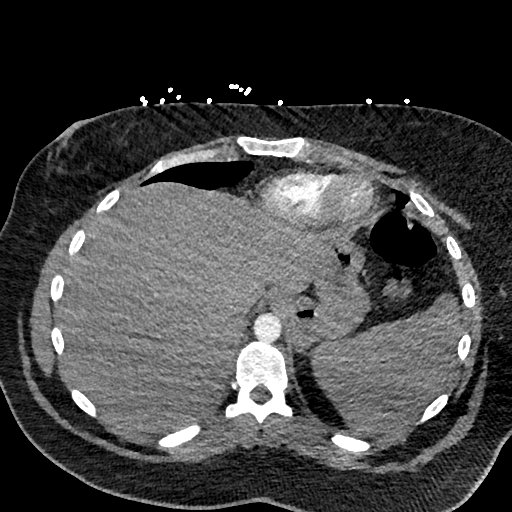
[im 58/403  lung]
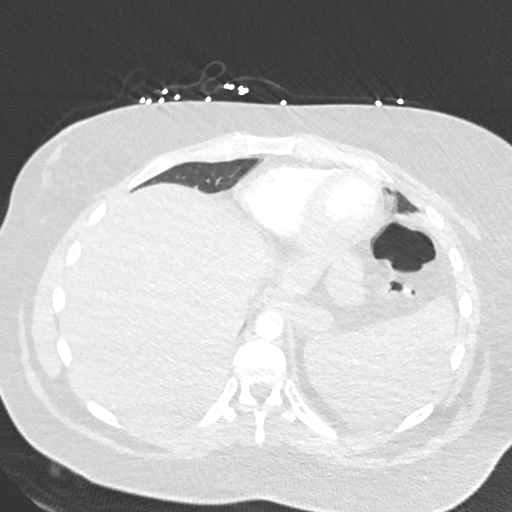
[im 77/403  mediastinal]
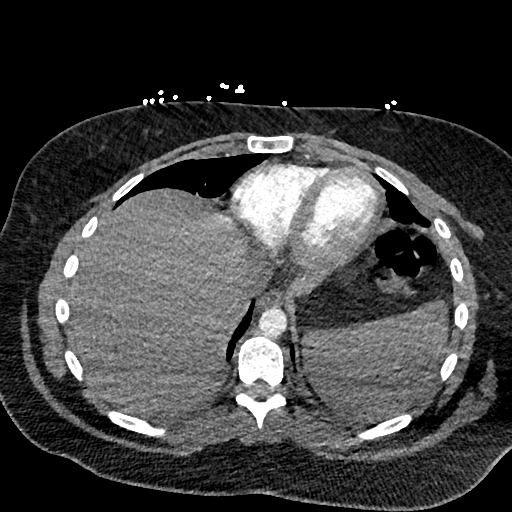
[im 115/403  lung]
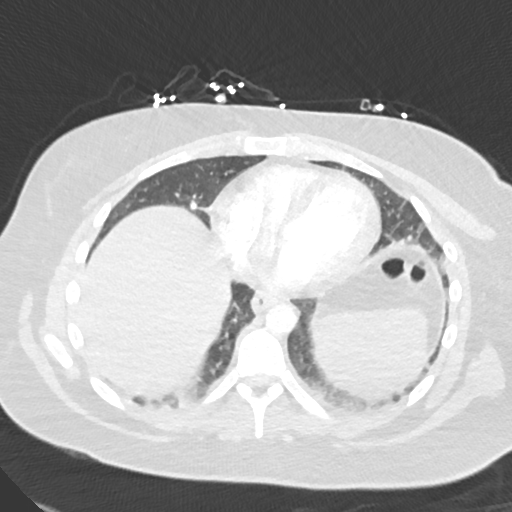
[im 135/403  mediastinal]
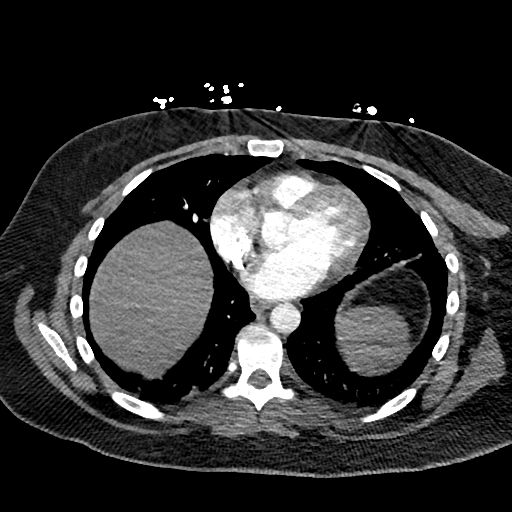
[im 154/403  lung]
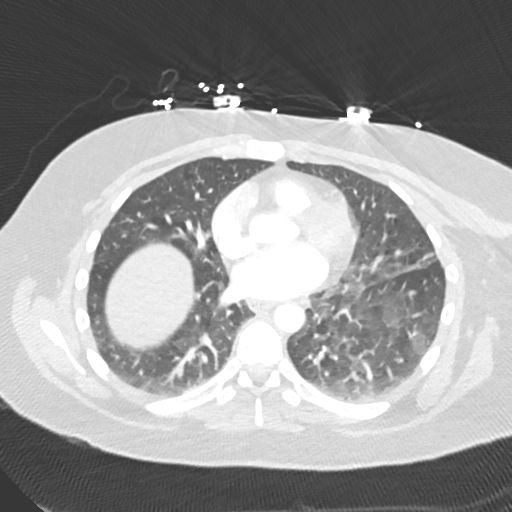
[im 173/403  mediastinal]
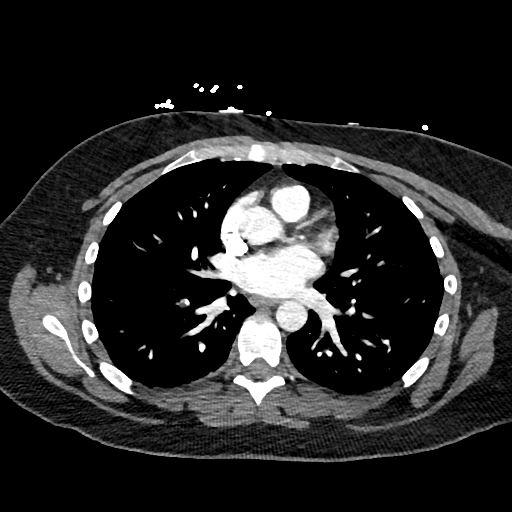
[im 192/403  lung]
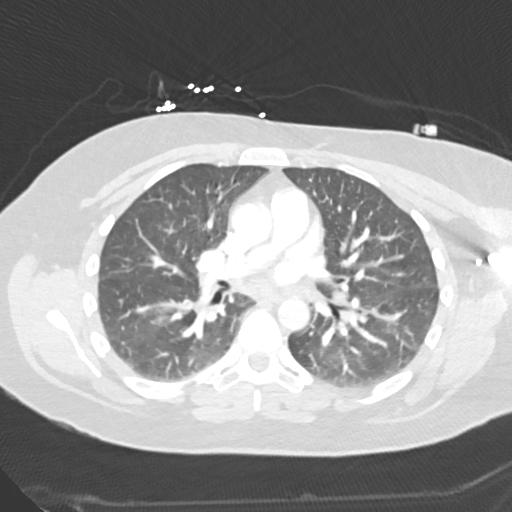
[im 211/403  mediastinal]
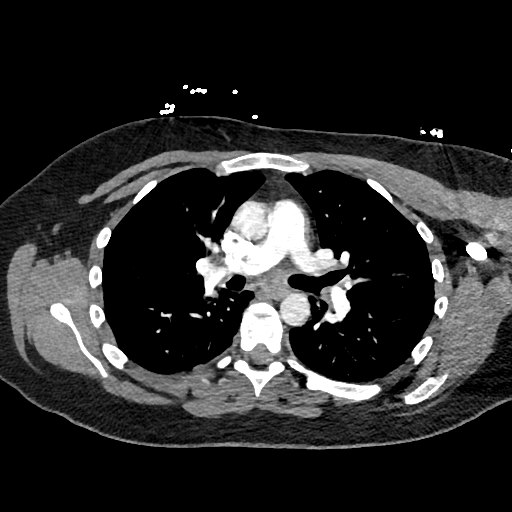
[im 230/403  lung]
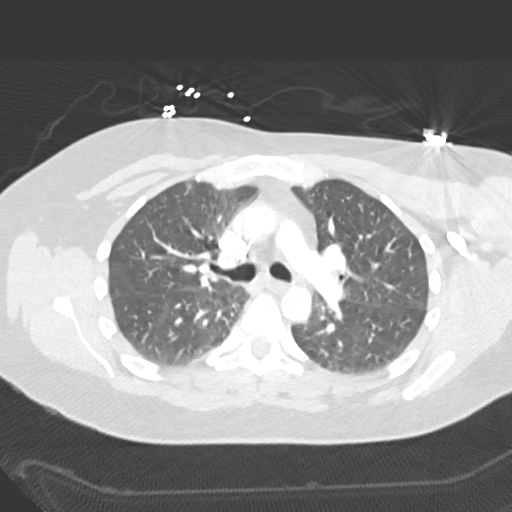
[im 249/403  mediastinal]
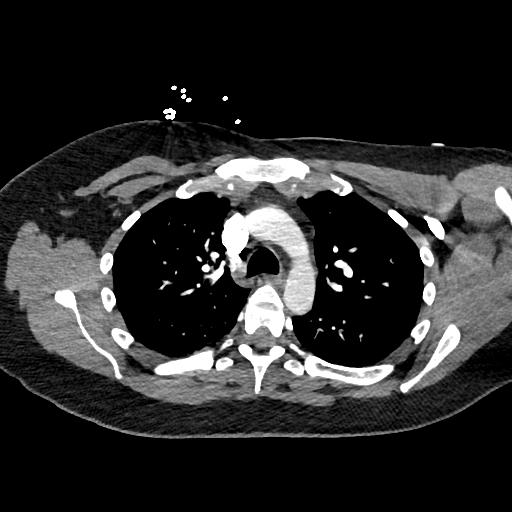
[im 269/403  lung]
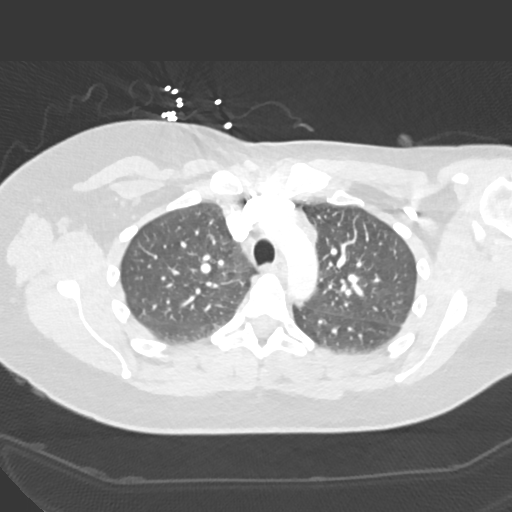
[im 307/403  mediastinal]
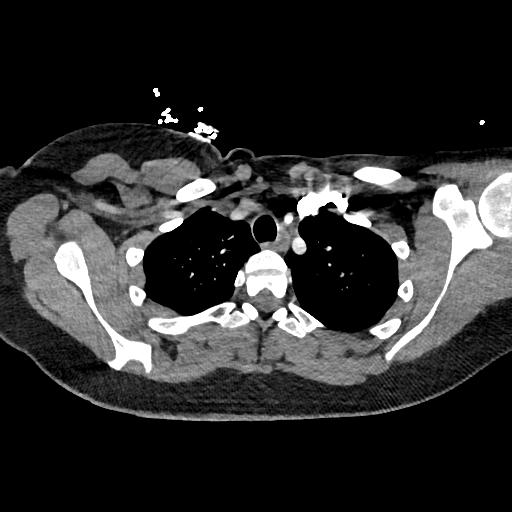
[im 326/403  lung]
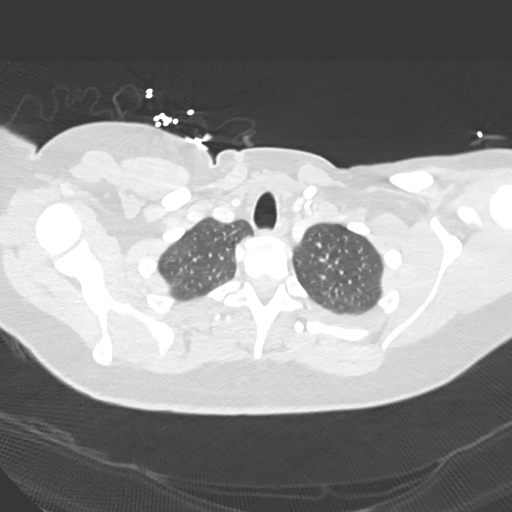
[im 345/403  mediastinal]
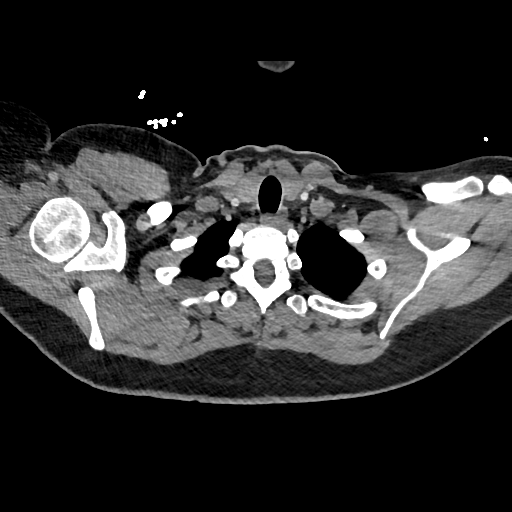
[im 364/403  lung]
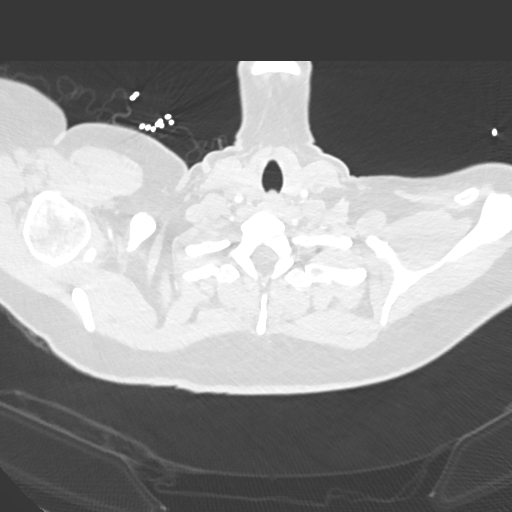
[im 383/403  mediastinal]
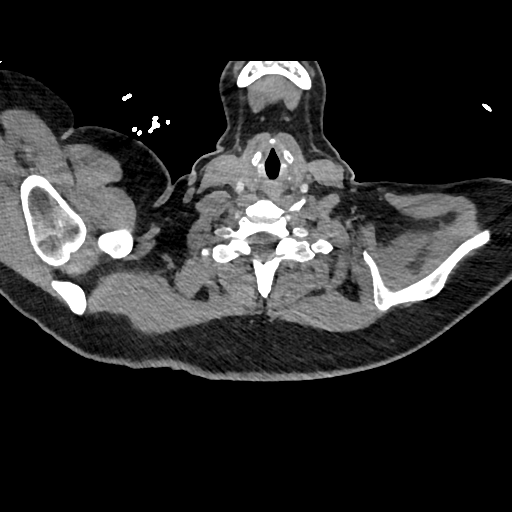

[Series 7: pe 2mm cor · coronal · 0.50mm/px · 1 of 151 slices shown]
[im 76/151  mediastinal]
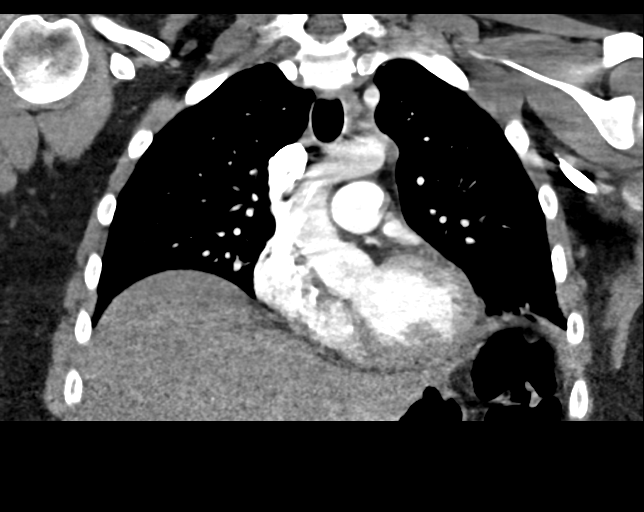

[19 of 36 positions shown; findings below may reference images not displayed]

FINDINGS: Vascular: Aorta normal caliber without aneurysm or dissection.
Pulmonary arteries well opacified. Filling defects identified in
BILATERAL lower lobe pulmonary arteries consistent with pulmonary
emboli. No RIGHT ventricular dilatation. RV/LV ratio =

Mediastinum/Lymph Nodes: Unremarkable esophagus. No and mediastinal
adenopathy. Visualize inferior cervical region normal appearance.

Lungs/Pleura: Bibasilar atelectasis. Lungs otherwise clear. No
pleural effusion or pneumothorax.

Upper abdomen: Normal appearance

Musculoskeletal: Osseous structures unremarkable.

Review of the MIP images confirms the above findings.
IMPRESSION: Small filling defects identified within BILATERAL lower lobe
pulmonary arteries compatible with pulmonary embolism.

Critical Value/emergent results were called by telephone at the time
of interpretation on 11/04/2015 at [DATE] to Dr. DEYHANA BORHANUDIN ,
who verbally acknowledged these results.

## 2017-05-09 ENCOUNTER — Telehealth: Payer: 59 | Admitting: Family

## 2017-05-09 DIAGNOSIS — J019 Acute sinusitis, unspecified: Secondary | ICD-10-CM | POA: Diagnosis not present

## 2017-05-09 MED ORDER — DOXYCYCLINE HYCLATE 100 MG PO TABS: 100.0000 mg | ORAL_TABLET | Freq: Two times a day (BID) | ORAL | 0 refills | Status: DC

## 2017-05-09 MED FILL — DOXYCYCLINE HYCLATE 100 MG: 100 | 10 days supply | Qty: 20 | Fill #0

## 2017-05-09 NOTE — Progress Notes (Signed)

## 2017-05-23 ENCOUNTER — Other Ambulatory Visit: Payer: Self-pay | Admitting: Oncology

## 2017-05-23 DIAGNOSIS — I2782 Chronic pulmonary embolism: Secondary | ICD-10-CM

## 2017-05-23 DIAGNOSIS — Z7901 Long term (current) use of anticoagulants: Secondary | ICD-10-CM

## 2017-07-24 ENCOUNTER — Other Ambulatory Visit (INDEPENDENT_AMBULATORY_CARE_PROVIDER_SITE_OTHER): Payer: 59

## 2017-07-24 DIAGNOSIS — Z7901 Long term (current) use of anticoagulants: Secondary | ICD-10-CM | POA: Diagnosis not present

## 2017-07-24 DIAGNOSIS — I2782 Chronic pulmonary embolism: Secondary | ICD-10-CM

## 2017-07-24 LAB — D-DIMER, QUANTITATIVE: D-Dimer, Quant: <0.27 ug/mL-FEU (ref 0.00–0.50)

## 2017-07-25 LAB — PROTEIN S ACTIVITY: PROTEIN S ACTIVITY: 99 % (ref 63–140)

## 2017-07-25 LAB — PROTEIN C ACTIVITY: Protein C Activity: 127 % (ref 73–180)

## 2017-07-26 LAB — CBC WITH DIFFERENTIAL/PLATELET
BASOS ABS: 0 10*3/uL (ref 0.0–0.2)
BASOS: 0 %
EOS (ABSOLUTE): 0.2 10*3/uL (ref 0.0–0.4)
Eos: 2 %
HEMATOCRIT: 39.8 % (ref 34.0–46.6)
Hemoglobin: 12.7 g/dL (ref 11.1–15.9)
IMMATURE GRANS (ABS): 0.1 10*3/uL (ref 0.0–0.1)
IMMATURE GRANULOCYTES: 1 %
LYMPHS: 28 %
Lymphocytes Absolute: 2.9 10*3/uL (ref 0.7–3.1)
MCH: 26.1 pg — ABNORMAL LOW (ref 26.6–33.0)
MCHC: 31.9 g/dL (ref 31.5–35.7)
MCV: 82 fL (ref 79–97)
MONOS ABS: 0.9 10*3/uL (ref 0.1–0.9)
Monocytes: 9 %
NEUTROS PCT: 60 %
Neutrophils Absolute: 6.1 10*3/uL (ref 1.4–7.0)
PLATELETS: 321 10*3/uL (ref 150–379)
RBC: 4.86 x10E6/uL (ref 3.77–5.28)
RDW: 16.4 % — AB (ref 12.3–15.4)
WBC: 10.3 10*3/uL (ref 3.4–10.8)

## 2017-07-26 LAB — COMPREHENSIVE METABOLIC PANEL
A/G RATIO: 1.4 (ref 1.2–2.2)
ALT: 16 IU/L (ref 0–32)
AST: 19 IU/L (ref 0–40)
Albumin: 4.3 g/dL (ref 3.5–5.5)
Alkaline Phosphatase: 107 IU/L (ref 39–117)
BUN/Creatinine Ratio: 10 (ref 9–23)
BUN: 6 mg/dL (ref 6–24)
Bilirubin Total: 0.2 mg/dL (ref 0.0–1.2)
CALCIUM: 9.7 mg/dL (ref 8.7–10.2)
CO2: 21 mmol/L (ref 20–29)
CREATININE: 0.62 mg/dL (ref 0.57–1.00)
Chloride: 105 mmol/L (ref 96–106)
GFR, EST AFRICAN AMERICAN: 130 mL/min/{1.73_m2} (ref >59)
GFR, EST NON AFRICAN AMERICAN: 112 mL/min/{1.73_m2} (ref >59)
Globulin, Total: 3 g/dL (ref 1.5–4.5)
Glucose: 80 mg/dL (ref 65–99)
Potassium: 4.7 mmol/L (ref 3.5–5.2)
Sodium: 141 mmol/L (ref 134–144)
TOTAL PROTEIN: 7.3 g/dL (ref 6.0–8.5)

## 2017-07-26 LAB — BETA-2-GLYCOPROTEIN I ABS, IGG/M/A: Beta-2 Glyco 1 IgM: <9 GPI IgM units (ref 0–32)

## 2017-07-26 LAB — CARDIOLIPIN ANTIBODIES, IGG, IGM, IGA
Anticardiolipin IgG: <9 GPL U/mL (ref 0–14)
Anticardiolipin IgM: 13 MPL U/mL — ABNORMAL HIGH (ref 0–12)

## 2017-07-26 LAB — RETICULOCYTES: Retic Ct Pct: 1.7 % (ref 0.6–2.6)

## 2017-07-26 LAB — LACTATE DEHYDROGENASE: LDH: 222 IU/L (ref 119–226)

## 2017-07-31 LAB — FACTOR 5 LEIDEN

## 2017-07-31 LAB — PROTHROMBIN GENE MUTATION

## 2017-08-07 ENCOUNTER — Encounter: Payer: Self-pay | Admitting: Oncology

## 2017-08-07 ENCOUNTER — Ambulatory Visit (INDEPENDENT_AMBULATORY_CARE_PROVIDER_SITE_OTHER): Payer: 59 | Admitting: Oncology

## 2017-08-07 ENCOUNTER — Other Ambulatory Visit: Payer: Self-pay

## 2017-08-07 VITALS — BP 131/70 | HR 92 | Temp 98.8°F | Ht 63.0 in | Wt 227.5 lb

## 2017-08-07 DIAGNOSIS — E669 Obesity, unspecified: Secondary | ICD-10-CM | POA: Diagnosis not present

## 2017-08-07 DIAGNOSIS — I2782 Chronic pulmonary embolism: Secondary | ICD-10-CM

## 2017-08-07 DIAGNOSIS — Z885 Allergy status to narcotic agent status: Secondary | ICD-10-CM | POA: Diagnosis not present

## 2017-08-07 DIAGNOSIS — Z6841 Body Mass Index (BMI) 40.0 and over, adult: Secondary | ICD-10-CM

## 2017-08-07 DIAGNOSIS — Z87442 Personal history of urinary calculi: Secondary | ICD-10-CM

## 2017-08-07 DIAGNOSIS — Z86711 Personal history of pulmonary embolism: Secondary | ICD-10-CM | POA: Diagnosis not present

## 2017-08-07 MED ORDER — LEVONORGESTREL 20 MCG/24HR IU IUD: 1.0000 | INTRAUTERINE_SYSTEM | Freq: Once | INTRAUTERINE | 0 refills | Status: DC

## 2017-08-07 NOTE — Progress Notes (Signed)
New Patient Hematology   Krystal Kim [id number] [birth year] 41 y.o. 08/07/2017  CC: Dr. Dois DavenportSandra Rivard.  Dr. 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  She has been in overall excellent health without any major medical or surgical illness.  She presented on Nov 04, 2015 with left anterior chest pain and was found to have low volume bilateral lower lobe pulmonary emboli.  She was on an estrogen containing oral contraceptive at the time.  She was treated for 6 months with Xarelto.  She has had no subsequent events.  An echocardiogram done the day after the PE was normal.  Trivial mitral regurgitation.  Normal ejection fraction 55-60%. She has recovered 99% and is fully active and working full-time.  No cardiorespiratory symptoms at this time and she specifically denies chest pain, chest pressure, dyspnea, or palpitations. She had 4 uncomplicated pregnancies.  She did require C-sections with her second third and fourth child.  Kidney stones during 1 of the pregnancies.  No history of preeclampsia. She has no signs or symptoms of a collagen vascular disease and specifically denies any polyarthralgia, polymyalgia, unusual skin rashes, or hair loss. She does not know anything about her mother's health.  Father is alive and well at age 42 and has had no thrombotic events.  She is an only child.  PMH: Past Medical History:  Diagnosis Date  . Anemia   . Obesity   . PE (pulmonary embolism) 10/2015  . PONV (postoperative nausea and vomiting)   . Seasonal allergies   No history of hepatitis, yellow jaundice, mononucleosis, diabetes, hypertension, ulcers, thyroid disease, seizure, stroke, inflammatory arthritis.  Kidney stone x1 during 1 of her pregnancies.  Past Surgical History:  Procedure Laterality Date  . CESAREAN SECTION    . CESAREAN SECTION N/A 01/19/2014   Procedure: REPEAT  CESAREAN SECTION;  Surgeon: Esmeralda ArthurSandra A Rivard, MD;  Location: [facility];  Service: Obstetrics;  Laterality: N/A;  . VAGINA RECONSTRUCTION SURGERY      Allergies: Allergies  Allergen Reactions  . Codeine     REACTION: Nausea and vomiting    Medications:  Current Outpatient Medications:  .  acetaminophen (TYLENOL) 325 MG tablet, Take 650 mg by mouth every 6 (six) hours as needed for mild pain., Disp: , Rfl:  .  benzonatate (TESSALON) 200 MG capsule, Take 1 capsule (200 mg total) by mouth 2 (two) times daily as needed for cough., Disp: 20 capsule, Rfl: 0 .  doxycycline (VIBRA-TABS) 100 MG tablet, Take 1 tablet (100 mg total) by mouth 2 (two) times daily., Disp: 20 tablet, Rfl: 0 .  loratadine (CLARITIN) 10 MG tablet, Take 1 tablet (10 mg total) by mouth daily., Disp: 30 tablet, Rfl: 11 Mirena IUD  Social History: Married.  4 healthy children ages 443, 817, 7615, and 3519.  She works in [employer] finance department here at [employer] She has never smoked.  Rare, less than a few drinks per year alcohol; no recreational  drug use.  Family History: Family History  Problem Relation Age of Onset  . Diabetes Father   . Hypertension Son   . Diabetes Maternal Aunt   . Heart disease Maternal Grandmother     Review of Systems: See HPI Remaining ROS negative.  Physical Exam: Blood pressure 131/70, pulse 92, temperature 98.8 F (37.1 C), temperature source Oral, height 5\' 3"  (1.6 m), weight 227 lb 8 oz (  103.2 kg), SpO2 100 %, unknown if currently breastfeeding. Wt Readings from Last 3 Encounters:  08/07/17 227 lb 8 oz (103.2 kg)  07/03/16 200 lb 3.2 oz (90.8 kg)  12/06/15 190 lb 3.2 oz (86.3 kg)     General appearance: Well-nourished Caucasian woman HENNT: Pharynx no erythema, exudate, mass, or ulcer. No thyromegaly or thyroid nodules Lymph nodes: No cervical, supraclavicular, or axillary lymphadenopathy Breasts: Lungs: Clear to auscultation, resonant to percussion throughout Heart: Regular rhythm,  no murmur, no gallop, no rub, no click, no edema Abdomen: Obese, soft, nontender, normal bowel sounds, no mass, no organomegaly Extremities: No edema, no calf tenderness Musculoskeletal: no joint deformities GU: Vascular: Carotid pulses 2+, no bruits, distal pulses: Dorsalis pedis 1+ symmetric Neurologic: Alert, oriented, PERRLA, optic discs sharp and vessels normal, no hemorrhage or exudate, cranial nerves grossly normal, motor strength 5 over 5, reflexes 1+ symmetric, upper body coordination normal, gait normal, Skin: No rash or ecchymosis    Lab Results: Lab Results White count differential: 60 neutrophils, 28 lymphocytes, 9 monocytes, 2 eosinophils  Component Value Date   WBC 10.3 07/24/2017   HGB 12.7 07/24/2017   HCT 39.8 07/24/2017   MCV 82 07/24/2017   PLT 321 07/24/2017     Chemistry      Component Value Date/Time   NA 141 07/24/2017 1138   K 4.7 07/24/2017 1138   CL 105 07/24/2017 1138   CO2 21 07/24/2017 1138   BUN 6 07/24/2017 1138   CREATININE 0.62 07/24/2017 1138      Component Value Date/Time   CALCIUM 9.7 07/24/2017 1138   ALKPHOS 107 07/24/2017 1138   AST 19 07/24/2017 1138   ALT 16 07/24/2017 1138   BILITOT 0.2 07/24/2017 1138    Special hematology testing:  LDH 222 normal less than or equal to 226, bilirubin 0.2, reticulocyte count 1.7%  Protein S activity 99% of control (63-140); protein C activity 127% (73-180), she tested negative for both prothrombin gene and factor V Leiden gene mutations, negative for presence of anticardiolipin antibodies or antibodies to beta-2 glycoprotein 1.  D-dimer 10 July 24, 2017: Undetectable. Lupus anticoagulant testing not done since medication list indicated she was still on Xarelto.    Impression: Isolated low volume bilateral lower lobe pulmonary emboli while on an estrogen containing oral contraceptive. No personal or family risk factors for thrombosis.  Genetic screening unremarkable.   Recommendation: She  will avoid estrogen containing preparations for obvious reasons. There is no reason why her daughter could not be placed on estrogen containing preparations if necessary. In fact, reevaluation of data collected over the last 30 years since the discovery of the factor V Leiden gene mutation indicates that heterozygote status in the index case, even if that person had a blood clot, does not mandate testing in other family members unless there is an otherwise a strong family history of clotting in multiple first-degree relatives.  In fact, newer recommendations no longer prohibited daughters of Leiden heterozygote parents from going on oral contraceptives if indicated without prior genetic testing.  This has been a huge paradigm shift in our thinking.    Cephas Darby, MD, FACP  Hematology-Oncology/Internal Medicine  08/07/2017, 3:24 PM

## 2017-08-07 NOTE — Patient Instructions (Signed)
Return as needed

## 2017-09-18 ENCOUNTER — Encounter: Payer: Self-pay | Admitting: Internal Medicine

## 2017-09-18 ENCOUNTER — Ambulatory Visit (INDEPENDENT_AMBULATORY_CARE_PROVIDER_SITE_OTHER): Payer: 59 | Admitting: Internal Medicine

## 2017-09-18 ENCOUNTER — Other Ambulatory Visit: Payer: Self-pay

## 2017-09-18 DIAGNOSIS — R21 Rash and other nonspecific skin eruption: Secondary | ICD-10-CM | POA: Diagnosis not present

## 2017-09-18 MED ORDER — CLOTRIMAZOLE-BETAMETHASONE 1-0.05 % EX CREA: 1.0000 "application " | TOPICAL_CREAM | Freq: Two times a day (BID) | CUTANEOUS | 0 refills | Status: DC

## 2017-09-18 MED FILL — CLOTRIMAZOLE-BETAMETHASONE: 1-0.05 | 20 days supply | Qty: 30 | Fill #0

## 2017-09-18 NOTE — Patient Instructions (Signed)
It was so nice to see you!  This rash is either due to a fungal infection or an allergic reaction. We will treat you for both. Please use this twice daily until the rash goes away.  -Dr. Nancy MarusMayo

## 2017-09-18 NOTE — Progress Notes (Signed)
   [facility] Phone: [phone]  Subjective:  Krystal Kim is a 42 year old female presenting to clinic with left lower leg rash for the last month. The rash is itchy. The rash is spreading. She thinks it may have started after shaving her legs. She has tried putting aquaphor, neosporin, and benadryl cream on the rash, all of which haven't helped. The rash is now a little bit sore to palpation. No fevers. No chills. No drainage.  ROS: See HPI for pertinent positives and negatives  Past Medical History- hx PE, obesity  Family history reviewed for today's visit. No changes.  Social history- patient is a never smoker  Objective: BP 116/66   Pulse 83   Temp 98.1 F (36.7 C) (Oral)   Wt 216 lb (98 kg)   SpO2 99%   BMI 38.26 kg/m  Gen: NAD, alert, cooperative with exam Skin: 12cm x 7 cm erythematous oval rash present on the left shin, rash seems to consist of coalescing erythematous papules Msk: No edema, warm and well-perfused  Assessment/Plan: Rash: Located on left shin. Likely fungal vs allergic. Could have started out as fungal infection and then developed an allergic reaction on top of it due to neosporin. KOH performed in clinic and was negative. - Will treat with Clotrimazole-Betamethasone bid until rash resolves - Follow-up if no improvement in the next 1-2 weeks - Precepted with attending   Willadean CarolKaty Mayo, MD PGY-3

## 2017-09-18 NOTE — Assessment & Plan Note (Addendum)
Located on left shin. Likely fungal vs allergic. Could have started out as fungal infection and then developed an allergic reaction on top of it due to neosporin. KOH performed in clinic and was negative. - Will treat with Clotrimazole-Betamethasone bid until rash resolves - Follow-up if no improvement in the next 1-2 weeks - Precepted with attending

## 2017-10-11 DIAGNOSIS — I2699 Other pulmonary embolism without acute cor pulmonale: Secondary | ICD-10-CM | POA: Diagnosis not present

## 2017-10-11 DIAGNOSIS — Z1231 Encounter for screening mammogram for malignant neoplasm of breast: Secondary | ICD-10-CM | POA: Diagnosis not present

## 2017-10-11 DIAGNOSIS — Z01419 Encounter for gynecological examination (general) (routine) without abnormal findings: Secondary | ICD-10-CM | POA: Diagnosis not present

## 2017-10-11 DIAGNOSIS — Z124 Encounter for screening for malignant neoplasm of cervix: Secondary | ICD-10-CM | POA: Diagnosis not present

## 2017-10-11 DIAGNOSIS — Z6841 Body Mass Index (BMI) 40.0 and over, adult: Secondary | ICD-10-CM | POA: Diagnosis not present

## 2017-11-08 ENCOUNTER — Other Ambulatory Visit: Payer: Self-pay

## 2017-11-08 ENCOUNTER — Ambulatory Visit (INDEPENDENT_AMBULATORY_CARE_PROVIDER_SITE_OTHER): Payer: 59 | Admitting: Internal Medicine

## 2017-11-08 ENCOUNTER — Other Ambulatory Visit: Payer: Self-pay | Admitting: *Deleted

## 2017-11-08 ENCOUNTER — Encounter: Payer: Self-pay | Admitting: Internal Medicine

## 2017-11-08 VITALS — BP 122/80 | HR 79 | Temp 99.3°F | Ht 63.0 in | Wt 236.0 lb

## 2017-11-08 DIAGNOSIS — R21 Rash and other nonspecific skin eruption: Secondary | ICD-10-CM

## 2017-11-08 DIAGNOSIS — Z13220 Encounter for screening for lipoid disorders: Secondary | ICD-10-CM | POA: Diagnosis not present

## 2017-11-08 MED ORDER — TRIAMCINOLONE ACETONIDE 0.5 % EX OINT: 1.0000 "application " | TOPICAL_OINTMENT | Freq: Two times a day (BID) | CUTANEOUS | 0 refills | Status: DC

## 2017-11-08 MED ORDER — TERBINAFINE 1 % EX GEL: CUTANEOUS | 0 refills | Status: DC

## 2017-11-08 MED FILL — TRIAMCINOLONE 0.5% OINTMENT: 0.5 | 30 days supply | Qty: 30 | Fill #0

## 2017-11-08 NOTE — Progress Notes (Signed)
42 y.o. year old female presents for well woman/preventative visit and annual GYN examination.  Acute Concerns: Rash- seen for this on 09/18/17. Thought to be allergic vs fungal. KOH negative. Prescribed Clotrimazole-Betamethasone cream bid. The cream helped initially, but then stopped helping. She is still using it twice a day. The rash is still itchy. Located on her left anterior shin. She states sometimes the rash becomes raised. This happens after she scratches it. The rash is not spreading. No fevers, no chills.  Diet: tries to eat healthy. Has tried eating 1000 calories/day diets in the past, which helps with weight loss, but she is unable to sustain this.  Exercise: Does not do any formal exercise, but is active with her kids throughout the day. Has a hard time finding time to exercise due to having 4 kids and also working a full time job.  Sexual/Birth History:  Sexually active with husband  Birth Control: IUD in place  Social:  Social History   Socioeconomic History  . Marital status: Married    Spouse name: Not on file  . Number of children: Not on file  . Years of education: Not on file  . Highest education level: Not on file  Occupational History  . Not on file  Social Needs  . Financial resource strain: Not on file  . Food insecurity:    Worry: Not on file    Inability: Not on file  . Transportation needs:    Medical: Not on file    Non-medical: Not on file  Tobacco Use  . Smoking status: Never Smoker  . Smokeless tobacco: Never Used  Substance and Sexual Activity  . Alcohol use: No  . Drug use: No  . Sexual activity: Yes    Birth control/protection: None  Lifestyle  . Physical activity:    Days per week: Not on file    Minutes per session: Not on file  . Stress: Not on file  Relationships  . Social connections:    Talks on phone: Not on file    Gets together: Not on file    Attends religious service: Not on file    Active member of club or organization:  Not on file    Attends meetings of clubs or organizations: Not on file    Relationship status: Not on file  Other Topics Concern  . Not on file  Social History Narrative   Lives with husband and 3 children.  Works in Audiological scientist at [employer].    Immunization: There is no immunization history for the selected administration types on file for this patient.  Cancer Screening:  Pap Smear: Performed at OBGYN's office- will obtain records  Mammogram: Had a mammogram this year and was normal  Physical Exam: VITALS: Reviewed GEN: Pleasant female, NAD HEENT: Normocephalic, PERRL, EOMI, no scleral icterus, bilateral TM pearly grey, nasal septum midline, MMM, uvula midline, no anterior or posterior lymphadenopathy, no thyromegaly CARDIAC:RRR, S1 and S2 present, no murmur, no heaves/thrills RESP: CTAB, normal effort ABD: soft, no tenderness, normal bowel sounds EXT: No edema, 2+ radial and DP pulses SKIN: 8cm x 4cm erythematous area on the left anterior shin, well-demarcated borders, no warmth  ASSESSMENT & PLAN: 42 y.o. female presents for annual well woman/preventative exam.   Rash: Improved with Clotrimazole-Betamethasone cream but has not resolved. Sounds more allergic in nature due to the rash becoming more raised and irritated when she scratches it. Appearance not consistent with cellulitis. - Will try Triamcinolone 0.5% ointment bid x 2  weeks - If no improvement, advised patient to switch to terbinafine gel bid - She will let me know if the rash persists after trying both of those topical meds  Screening for lipid disorders: - Check lipid panel, as patient has never had this done.  Willadean Carol, MD PGY-3

## 2017-11-08 NOTE — Progress Notes (Signed)
Health Maintenance ROI for [facility] (PAP and Mammo) signed and placed in basket up front. Staff message sent to Health maintenance pool. Tyshika Baldridge, Maryjo Rochester, CMA

## 2017-11-08 NOTE — Patient Instructions (Signed)
It was so nice to see you today!  I have checked your cholesterol and I will call you with these results.  For your rash- please use the Triamcinolone ointment twice daily for 2 weeks. If this does not help the rash after two weeks, then switch to the terbinafine gel twice daily. Please let me know if the rash continues.  -Dr. Nancy Marus

## 2017-11-08 NOTE — Telephone Encounter (Signed)
Per pharmacy this medication only comes in either a cream or a spray.  Original script is written for a gel.  Will forward to MD and ask which she prefer. Jazmin Hartsell,CMA

## 2017-11-08 NOTE — Assessment & Plan Note (Signed)
Improved with Clotrimazole-Betamethasone cream but has not resolved. Sounds more allergic in nature due to the rash becoming more raised and irritated when she scratches it. Appearance not consistent with cellulitis. - Will try Triamcinolone 0.5% ointment bid x 2 weeks - If no improvement, advised patient to switch to terbinafine gel bid - She will let me know if the rash persists after trying both of those topical meds

## 2017-11-08 NOTE — Assessment & Plan Note (Signed)
-   Check lipid panel, as patient has never had this done.

## 2017-11-09 LAB — LIPID PANEL
CHOLESTEROL TOTAL: 173 mg/dL (ref 100–199)
Chol/HDL Ratio: 3.1 ratio (ref 0.0–4.4)
HDL: 56 mg/dL (ref >39)
LDL CALC: 91 mg/dL (ref 0–99)
TRIGLYCERIDES: 132 mg/dL (ref 0–149)
VLDL Cholesterol Cal: 26 mg/dL (ref 5–40)

## 2017-11-12 ENCOUNTER — Other Ambulatory Visit: Payer: Self-pay | Admitting: Internal Medicine

## 2017-11-12 MED ORDER — TERBINAFINE HCL 1 % EX CREA: 1.0000 "application " | TOPICAL_CREAM | Freq: Two times a day (BID) | CUTANEOUS | 0 refills | Status: DC

## 2017-11-12 NOTE — Telephone Encounter (Signed)
Sent in a new prescription for Terbinafine cream. Thanks!

## 2017-11-15 ENCOUNTER — Encounter: Payer: Self-pay | Admitting: Internal Medicine

## 2017-12-27 ENCOUNTER — Other Ambulatory Visit: Payer: Self-pay

## 2017-12-27 ENCOUNTER — Encounter: Payer: Self-pay | Admitting: Family Medicine

## 2017-12-27 ENCOUNTER — Ambulatory Visit (INDEPENDENT_AMBULATORY_CARE_PROVIDER_SITE_OTHER): Payer: 59 | Admitting: Family Medicine

## 2017-12-27 ENCOUNTER — Encounter: Payer: 59 | Admitting: Family Medicine

## 2017-12-27 VITALS — BP 102/70 | HR 81 | Temp 98.0°F | Wt 235.0 lb

## 2017-12-27 DIAGNOSIS — R21 Rash and other nonspecific skin eruption: Secondary | ICD-10-CM

## 2017-12-27 MED ORDER — CETIRIZINE HCL 10 MG PO TABS: 10.0000 mg | ORAL_TABLET | Freq: Every day | ORAL | 11 refills | Status: DC

## 2017-12-27 MED ORDER — TERBINAFINE HCL 1 % EX CREA: 1.0000 "application " | TOPICAL_CREAM | Freq: Two times a day (BID) | CUTANEOUS | 0 refills | Status: DC

## 2017-12-27 NOTE — Progress Notes (Signed)
Erroneous encounter. Krystal Kim, Krystal RochesterJessica Dawn, CMA

## 2017-12-27 NOTE — Patient Instructions (Addendum)
It was a pleasure to see you today! Thank you for choosing [facility] for your primary care. Krystal Kim was seen for rash. It is possibly a fungal infection. Apply the Lotrimin two times a day. Try the Zyrtec for itching. Come back to the clinic in 4 weeks.   Best,  Thomes DinningBrad Misheel Gowans, MD, MS FAMILY MEDICINE RESIDENT - PGY2 12/27/2017 3:34 PM

## 2017-12-27 NOTE — Progress Notes (Addendum)
    Subjective:  Krystal Kim is a 42 y.o. female who [facility] with a chief complaint of rash.   HPI:  Rash Pt has had rash on left lower extremity for 4 months. Pt has been seen by primary care physician for this rash. It has been getting worse. She has been treated with lotrisone from a few weeks without improvement. She has also tried triamcinolone ointment as well without improvement. Pt tried lamisil (terbinafine) for a few days, but discontinued therapy due to burning.   ROS: Per HPI  Objective:  Physical Exam: There were no vitals taken for this visit.  Gen: NAD, resting comfortably Skin: macule left lower extremity, blanching, not warm, nontender, some satalite lesions. No drainage. There is some scaling, but not much.  Neuro: grossly normal, moves all extremities Psych: Normal affect and thought content    No results found for this or any previous visit (from the past 72 hour(s)).   Assessment/Plan:  Rash and nonspecific skin eruption Undiagnosed macule, possibly tinea incognito given h/o prior steroid treatments. Pt was likely undertreated with antifungal. Recommend retrial of lamisil. Pt to return in 1 month to monitor improvement. If non, recommend skin biopsy.    Lab Orders  No laboratory test(s) ordered today    Meds ordered this encounter  Medications  . cetirizine (ZYRTEC) 10 MG tablet    Sig: Take 1 tablet (10 mg total) by mouth daily.    Dispense:  30 tablet    Refill:  11  . terbinafine (LAMISIL) 1 % cream    Sig: Apply 1 application topically 2 (two) times daily.    Dispense:  30 g    Refill:  0    Thomes DinningBrad Tanica Gaige, MD, MS FAMILY MEDICINE RESIDENT - PGY2 12/27/2017 3:48 PM   I was present during the history and physical exam Krystal Kim

## 2017-12-27 NOTE — Assessment & Plan Note (Addendum)
Undiagnosed plaque, possibly tinea incognito given h/o prior steroid treatments. Pt was likely undertreated with antifungal. Recommend retrial of lamisil. Pt to return in 1 month to monitor improvement. If non, recommend skin biopsy.

## 2017-12-28 ENCOUNTER — Encounter: Payer: Self-pay | Admitting: Family Medicine

## 2018-01-25 ENCOUNTER — Telehealth: Payer: Self-pay | Admitting: Family Medicine

## 2018-01-25 NOTE — Telephone Encounter (Signed)
Hi Dr. Deirdre Priesthambliss,   Thanks for checking in! It's a little bit better and is definitely smaller. But it's still there and still raised & itchy. So, I'd say improving but very slowly. I just keep putting the Lamisil on twice a day.  Rily  Eniola L. Oren Sectionouch [place]  [employer] Assistant Director of Finance Direct Dial: [phone]  Website: [email or web address]    From: Oda Coganhambliss, Lee  Sent: Thursday, January 24, 2018 2:19 PM To: Magnus SinningCouch, [email or web address]> Subject: Rash?  Ilianna  Hi. Just checking to see how the leg rash is doing?  Nedra HaiLee.

## 2018-03-07 ENCOUNTER — Other Ambulatory Visit: Payer: Self-pay

## 2018-03-07 ENCOUNTER — Ambulatory Visit (INDEPENDENT_AMBULATORY_CARE_PROVIDER_SITE_OTHER): Payer: 59 | Admitting: Family Medicine

## 2018-03-07 VITALS — BP 112/78 | HR 62 | Temp 98.3°F | Ht 63.0 in | Wt 233.4 lb

## 2018-03-07 DIAGNOSIS — R21 Rash and other nonspecific skin eruption: Secondary | ICD-10-CM

## 2018-03-07 MED ORDER — TERBINAFINE HCL 250 MG PO TABS: 250.0000 mg | ORAL_TABLET | Freq: Every day | ORAL | 0 refills | Status: AC

## 2018-03-07 MED FILL — TERBINAFINE HCL 250 MG TAB: 250 | 14 days supply | Qty: 14 | Fill #0

## 2018-03-07 NOTE — Progress Notes (Addendum)
  Subjective:   Patient ID: Krystal Kim    DOB: [birth year], 42 y.o. female   MRN: [id number]  Krystal Kim is a 42 y.o. female with a history of obesity, prior PE here for rash.  Lower extremity rash Previously seen 7/19 for same. Previously tried lotrisone and triamcinolone ointment without improvement. Patient reports improvement with smaller size with lamisil ointment prescribed at last visit however is still itching. Denies bleeding. Also endorses small spot that became raised about a week ago that was hard and painful. She reports accidentally shaving it with a razor, it bled afterwards, now seems to be healing. Denies rashes anywhere else.  Review of Systems:  Per HPI.  PMFSH, medications and smoking status reviewed.  Objective:   BP 112/78   Pulse 62   Temp 98.3 F (36.8 C) (Oral)   Ht 5\' 3"  (1.6 m)   Wt 233 lb 6.4 oz (105.9 kg)   SpO2 99%   BMI 41.34 kg/m  Vitals and nursing note reviewed.  General: obese female in no acute distress with non-toxic appearance Skin: warm, dry. ~28mm macular slightly erythematous rash present over shin. ~28mm macule with scab overlying. Extremities: warm and well perfused, normal tone MSK: ROM grossly intact, strength intact, gait normal Neuro: Alert and oriented, speech normal      Assessment & Plan:   Rash and nonspecific skin eruption Large flesh colored lesion objectively improved with terbinafine cream however pruritis still present. Has also developed new slightly hyperpigmented lesion on anterior shin, likely irritation from ingrown hair subjected to trauma from razor, now healing. Recommended continued use of terbinafine cream in addition to PO daily. Offered biopsy of new hyperpigmented lesion however is healing so patient deferred at this time.  No orders of the defined types were placed in this encounter.  Meds ordered this encounter  Medications  . terbinafine (LAMISIL) 250 MG tablet    Sig: Take 1 tablet (250 mg total) by  mouth daily for 14 days.    Dispense:  14 tablet    Refill:  0    Ellwood Dense, DO PGY-2, [facility] 03/07/2018 2:29 PM    I was present during history and physical and agree with above Carney Living

## 2018-03-07 NOTE — Assessment & Plan Note (Addendum)
Large flesh colored lesion objectively improved with terbinafine cream however pruritis still present. Has also developed new slightly hyperpigmented lesion on anterior shin, likely irritation from ingrown hair subjected to trauma from razor, now healing. Recommended continued use of terbinafine cream in addition to PO daily. Offered biopsy of new hyperpigmented lesion however is healing so patient deferred at this time.

## 2018-04-19 DIAGNOSIS — H5203 Hypermetropia, bilateral: Secondary | ICD-10-CM | POA: Diagnosis not present

## 2018-08-12 ENCOUNTER — Encounter: Payer: Self-pay | Admitting: *Deleted

## 2018-12-16 ENCOUNTER — Telehealth: Payer: 59 | Admitting: Physician Assistant

## 2018-12-16 ENCOUNTER — Encounter: Payer: Self-pay | Admitting: Physician Assistant

## 2018-12-16 ENCOUNTER — Other Ambulatory Visit: Payer: Self-pay

## 2018-12-16 ENCOUNTER — Telehealth (INDEPENDENT_AMBULATORY_CARE_PROVIDER_SITE_OTHER): Payer: 59 | Admitting: Family Medicine

## 2018-12-16 DIAGNOSIS — J069 Acute upper respiratory infection, unspecified: Secondary | ICD-10-CM | POA: Diagnosis not present

## 2018-12-16 DIAGNOSIS — R52 Pain, unspecified: Secondary | ICD-10-CM

## 2018-12-16 DIAGNOSIS — R05 Cough: Secondary | ICD-10-CM

## 2018-12-16 DIAGNOSIS — U071 COVID-19: Secondary | ICD-10-CM | POA: Diagnosis not present

## 2018-12-16 DIAGNOSIS — J029 Acute pharyngitis, unspecified: Secondary | ICD-10-CM

## 2018-12-16 DIAGNOSIS — Z20822 Contact with and (suspected) exposure to covid-19: Secondary | ICD-10-CM

## 2018-12-16 DIAGNOSIS — R059 Cough, unspecified: Secondary | ICD-10-CM

## 2018-12-16 MED ORDER — BENZONATATE 100 MG PO CAPS: 100.0000 mg | ORAL_CAPSULE | Freq: Three times a day (TID) | ORAL | 0 refills | Status: DC | PRN

## 2018-12-16 MED FILL — BENZONATATE 100 MG CAPS: 100 | 6 days supply | Qty: 20 | Fill #0

## 2018-12-16 NOTE — Progress Notes (Signed)
[facility] Telemedicine Visit  Patient consented to have virtual visit. Method of visit: Telephone  Encounter participants: Patient: Krystal Kim - located at home Provider: Bonnita Hollow - located at office Others (if applicable): No  Chief Complaint: cough, cold-like  HPI:  Symptom durations: 3 days.  Symptoms: Head congestion, sore throat, cough, chest pain, pain in back when coughing, difficulty breathing with chest pain, achy, tired/fatigued  Traveled to [facility] recently for 8 days, just returned yesterday  Treatments thus far: Taking dayquil/night quil, which help with symptoms.  Denies feeling like Pulmonary embolism, says that pulmonary bothers him had much more pleuritic pain with deep inspiration  ROS: No fever  Pertinent PMHx: History of pulmonary embolism  Exam:  Respiratory: Audible cough, able to speak full sentences without having to pause, no acute respiratory distress  Assessment/Plan: Based on symptoms recommend being tested for coronavirus.  Recommend continue OTC treatments for symptom relief of cough congestion etc. recommend patient self quarantine pending testing results.  Red flag symptoms discussed and patient return to clinic if worsening symptoms.   Time spent during visit with patient: 10 minutes

## 2018-12-16 NOTE — Progress Notes (Signed)
E-Visit for Corona Virus Screening   Your current symptoms could be consistent with the coronavirus.  Call your health care provider or local health department to request and arrange formal testing. Many health care providers can now test patients at their office but not all are.  Please quarantine yourself while awaiting your test results.  [place] [phone], [place] [facility] [phone], [place] [phone] or visit [email or web address]  and You have been enrolled in MyChart Home Monitoring for COVID-19.  Daily you will receive a questionnaire within the MyChart website. Our COVID-19 response team will be monitoring your responses daily.    COVID-19 is a respiratory illness with symptoms that are similar to the flu. Symptoms are typically mild to moderate, but there have been cases of severe illness and death due to the virus. The following symptoms may appear 2-14 days after exposure: . Fever . Cough . Shortness of breath or difficulty breathing . Chills . Repeated shaking with chills . Muscle pain . Headache . Sore throat . New loss of taste or smell . Fatigue . Congestion or runny nose . Nausea or vomiting . Diarrhea  It is vitally important that if you feel that you have an infection such as this virus or any other virus that you stay home and away from places where you may spread it to others.  You should self-quarantine for 14 days if you have symptoms that could potentially be coronavirus or have been in close contact a with a person diagnosed with COVID-19 within the last 2 weeks. You should avoid contact with people age 65 and older.   You should wear a mask or cloth face covering over your nose and mouth if you must be around other people or animals, including pets (even at home). Try to stay at least 6 feet away from other people. This will protect the  people around you.  You can use medication such as A prescription cough medication called Tessalon Perles 100 mg. You may take 1-2 capsules every 8 hours as needed for cough  You may also take acetaminophen (Tylenol) as needed for fever.  I have also provided a work note.    Reduce your risk of any infection by using the same precautions used for avoiding the common cold or flu:  . Wash your hands often with soap and warm water for at least 20 seconds.  If soap and water are not readily available, use an alcohol-based hand sanitizer with at least 60% alcohol.  . If coughing or sneezing, cover your mouth and nose by coughing or sneezing into the elbow areas of your shirt or coat, into a tissue or into your sleeve (not your hands). . Avoid shaking hands with others and consider head nods or verbal greetings only. . Avoid touching your eyes, nose, or mouth with unwashed hands.  . Avoid close contact with people who are sick. . Avoid places or events with large numbers of people in one location, like concerts or sporting events. . Carefully consider travel plans you have or are making. . If you are planning any travel outside or inside the [place], visit the CDC's Travelers' Health webpage for the latest health notices. . If you have some symptoms but not all symptoms, continue to monitor at home and seek medical attention if your symptoms worsen. . If you are having a medical emergency, call 911.  HOME CARE . Only take medications as instructed by your medical team. .   Drink plenty of fluids and get plenty of rest. . A steam or ultrasonic humidifier can help if you have congestion.   GET HELP RIGHT AWAY IF YOU HAVE EMERGENCY WARNING SIGNS** FOR COVID-19. If you or someone is showing any of these signs seek emergency medical care immediately. Call 911 or proceed to your closest emergency facility if: . You develop worsening high fever. . Trouble breathing . Bluish lips or face . Persistent pain  or pressure in the chest . New confusion . Inability to wake or stay awake . You cough up blood. . Your symptoms become more severe  **This list is not all possible symptoms. Contact your medical provider for any symptoms that are sever or concerning to you.   MAKE SURE YOU   Understand these instructions.  Will watch your condition.  Will get help right away if you are not doing well or get worse.  Your e-visit answers were reviewed by a board certified advanced clinical practitioner to complete your personal care plan.  Depending on the condition, your plan could have included both over the counter or prescription medications.  If there is a problem please reply once you have received a response from your provider.  Your safety is important to us.  If you have drug allergies check your prescription carefully.    You can use MyChart to ask questions about today's visit, request a non-urgent call back, or ask for a work or school excuse for 24 hours related to this e-Visit. If it has been greater than 24 hours you will need to follow up with your provider, or enter a new e-Visit to address those concerns. You will get an e-mail in the next two days asking about your experience.  I hope that your e-visit has been valuable and will speed your recovery. Thank you for using e-visits.   I spent 5-10 minutes on review and completion of this note- Sahar Osman PAC  

## 2018-12-17 ENCOUNTER — Encounter (INDEPENDENT_AMBULATORY_CARE_PROVIDER_SITE_OTHER): Payer: Self-pay

## 2018-12-17 ENCOUNTER — Telehealth: Payer: Self-pay | Admitting: *Deleted

## 2018-12-17 ENCOUNTER — Other Ambulatory Visit: Payer: Self-pay

## 2018-12-17 DIAGNOSIS — Z20822 Contact with and (suspected) exposure to covid-19: Secondary | ICD-10-CM

## 2018-12-17 DIAGNOSIS — R6889 Other general symptoms and signs: Secondary | ICD-10-CM | POA: Diagnosis not present

## 2018-12-17 NOTE — Telephone Encounter (Signed)
Pt scheduled for covid testing today @ [facility] @ 11:45. Instructions given and order placed

## 2018-12-17 NOTE — Telephone Encounter (Signed)
-----   Message from Bonnita Hollow, MD sent at 12/16/2018  2:02 PM EDT ----- Patient with cough, URI symptoms for the past 3 days.  Also travel to endemic area in [place].  Recommend testing for coronavirus.

## 2018-12-18 ENCOUNTER — Encounter (INDEPENDENT_AMBULATORY_CARE_PROVIDER_SITE_OTHER): Payer: Self-pay

## 2018-12-19 ENCOUNTER — Encounter (INDEPENDENT_AMBULATORY_CARE_PROVIDER_SITE_OTHER): Payer: Self-pay

## 2018-12-20 ENCOUNTER — Encounter (INDEPENDENT_AMBULATORY_CARE_PROVIDER_SITE_OTHER): Payer: Self-pay

## 2018-12-21 ENCOUNTER — Encounter (INDEPENDENT_AMBULATORY_CARE_PROVIDER_SITE_OTHER): Payer: Self-pay

## 2018-12-21 LAB — NOVEL CORONAVIRUS, NAA: SARS-CoV-2, NAA: NOT DETECTED

## 2019-01-03 ENCOUNTER — Ambulatory Visit (INDEPENDENT_AMBULATORY_CARE_PROVIDER_SITE_OTHER): Payer: 59 | Admitting: Family Medicine

## 2019-01-03 ENCOUNTER — Encounter: Payer: Self-pay | Admitting: Family Medicine

## 2019-01-03 ENCOUNTER — Other Ambulatory Visit: Payer: Self-pay

## 2019-01-03 VITALS — BP 110/70 | HR 95 | Ht 63.0 in | Wt 237.5 lb

## 2019-01-03 DIAGNOSIS — M722 Plantar fascial fibromatosis: Secondary | ICD-10-CM | POA: Diagnosis not present

## 2019-01-03 DIAGNOSIS — Z0000 Encounter for general adult medical examination without abnormal findings: Secondary | ICD-10-CM

## 2019-01-03 NOTE — Assessment & Plan Note (Addendum)
Patient with BMI of 42, dietary changes discussed with patient -Information regarding plant-based diet provided -Patient to follow-up next year or as needed for general health maintenance -Patient to have Pap smear with OB/GYN soon

## 2019-01-03 NOTE — Patient Instructions (Addendum)
Plantar Fasciitis  Plantar fasciitis is a painful foot condition that affects the heel. It occurs when the band of tissue that connects the toes to the heel bone (plantar fascia) becomes irritated. This can happen as the result of exercising too much or doing other repetitive activities (overuse injury). The pain from plantar fasciitis can range from mild irritation to severe pain that makes it difficult to walk or move. The pain is usually worse in the morning after sleeping, or after sitting or lying down for a while. Pain may also be worse after long periods of walking or standing. What are the causes? This condition may be caused by:  Standing for long periods of time.  Wearing shoes that do not have good arch support.  Doing activities that put stress on joints (high-impact activities), including running, aerobics, and ballet.  Being overweight.  An abnormal way of walking (gait).  Tight muscles in the back of your lower leg (calf).  High arches in your feet.  Starting a new athletic activity. What are the signs or symptoms? The main symptom of this condition is heel pain. Pain may:  Be worse with first steps after a time of rest, especially in the morning after sleeping or after you have been sitting or lying down for a while.  Be worse after long periods of standing still.  Decrease after 30-45 minutes of activity, such as gentle walking. How is this diagnosed? This condition may be diagnosed based on your medical history and your symptoms. Your health care provider may ask questions about your activity level. Your health care provider will do a physical exam to check for:  A tender area on the bottom of your foot.  A high arch in your foot.  Pain when you move your foot.  Difficulty moving your foot. You may have imaging tests to confirm the diagnosis, such as:  X-rays.  Ultrasound.  MRI. How is this treated? Treatment for plantar fasciitis depends on how  severe your condition is. Treatment may include:  Rest, ice, applying pressure (compression), and raising the affected foot (elevation). This may be called RICE therapy. Your health care provider may recommend RICE therapy along with over-the-counter pain medicines to manage your pain.  Exercises to stretch your calves and your plantar fascia.  A splint that holds your foot in a stretched, upward position while you sleep (night splint).  Physical therapy to relieve symptoms and prevent problems in the future.  Injections of steroid medicine (cortisone) to relieve pain and inflammation.  Stimulating your plantar fascia with electrical impulses (extracorporeal shock wave therapy). This is usually the last treatment option before surgery.  Surgery, if other treatments have not worked after 12 months. Follow these instructions at home:  Managing pain, stiffness, and swelling  If directed, put ice on the painful area: ? Put ice in a plastic bag, or use a frozen bottle of water. ? Place a towel between your skin and the bag or bottle. ? Roll the bottom of your foot over the bag or bottle. ? Do this for 20 minutes, 2-3 times a day.  Wear athletic shoes that have air-sole or gel-sole cushions, or try wearing soft shoe inserts that are designed for plantar fasciitis.  Raise (elevate) your foot above the level of your heart while you are sitting or lying down. Activity  Avoid activities that cause pain. Ask your health care provider what activities are safe for you.  Do physical therapy exercises and stretches as told   by your health care provider.  Try activities and forms of exercise that are easier on your joints (low-impact). Examples include swimming, water aerobics, and biking. General instructions  Take over-the-counter and prescription medicines only as told by your health care provider.  Wear a night splint while sleeping, if told by your health care provider. Loosen the splint  if your toes tingle, become numb, or turn cold and blue.  Maintain a healthy weight, or work with your health care provider to lose weight as needed.  Keep all follow-up visits as told by your health care provider. This is important. Contact a health care provider if you:  Have symptoms that do not go away after caring for yourself at home.  Have pain that gets worse.  Have pain that affects your ability to move or do your daily activities. Summary  Plantar fasciitis is a painful foot condition that affects the heel. It occurs when the band of tissue that connects the toes to the heel bone (plantar fascia) becomes irritated.  The main symptom of this condition is heel pain that may be worse after exercising too much or standing still for a long time.  Treatment varies, but it usually starts with rest, ice, compression, and elevation (RICE therapy) and over-the-counter medicines to manage pain.    Vegetarian Eating Information Many people may prefer vegetarian eating for religious, environmental, or personal reasons. These diets are often lower in calories, salt, sugar, cholesterol, and saturated and trans fats. Vegetarian eating provides significant health benefits. People who eat a vegetarian diet often have lower rates of:  Obesity.  Diabetes.  Breast and colon cancers.  Cardiovascular and gallbladder diseases. What are the types of vegetarian eating?  Vegetarian eating includes dietary choices that focus on eating mostly vegetables and fruit, grains, beans, nuts, and seeds. There are several different types of vegetarian eating. Talk with a diet and nutrition specialist (dietitian) about what type of vegetarian diet is best for you. Lacto-ovo vegetarian  Recommended foods: fruits and vegetables, milk and dairy, eggs, grains, soy and vegetable protein, beans, nuts, and seeds.  Foods to avoid: meat, poultry, seafood, animal-based broths and gravies, and gelatin.  Lacto-vegetarian  Recommended foods: fruits and vegetables, milk and dairy, grains, soy and vegetable protein, beans, nuts, and seeds.  Foods to avoid: meat, poultry, seafood, animal-based broths and gravies, gelatin, and eggs. Vegan  Recommended foods: fruits and vegetables, grains, soy and vegetable protein, beans, nuts, and seeds.  Foods to avoid: meat, poultry, seafood, animal-based broths and gravies, gelatin, eggs, milk and dairy, and honey. What do I need to know about vegetarian eating? All vegetarian diets restrict proteins that come from animals. Foods that come from animals have important nutrients, such as protein, fats, vitamins, and minerals. It is important to get these nutrients from other types of foods. If you think you may not be getting the right nutrients, or if you do not eat any animal products, talk with your health care provider or dietitian about taking supplements. A dietitian can help determine your individual nutrient needs. What are tips for following this plan? Eat a diet that includes a variety of fruits, vegetables, whole grains, and protein sources. This is important to make sure you get enough of the following nutrients: Protein Healthy protein sources include:  Eggs, milk, and cheese. Soy products. Tofu, tempeh, and textured vegetable protein (TVP). Quinoa. Hemp seeds. Other protein sources include:  Beans, such as black beans or kidney beans. Other legumes, such as lentils  and split peas. Nuts, such as almonds, [place] nuts, and pecans. Seeds, such as sunflower seeds. To get the most benefit from plant-based proteins, combine two or more sources of plant protein with whole grains in one dish. Examples include beans and rice, almond butter on bread, or sunflower seeds on noodles. Vitamin B12 Sources of vitamin B12 include:  Cheese and eggs. Breakfast cereals and other prepared products that have vitamin B12 added (fortified products). If you eat a vegan  diet, ask your health care provider or dietitian about taking a B12 supplement. Vitamin D Good sources of vitamin D include:  Egg yolks. Fortified dairy products. Fortified orange juice. Mushrooms. Cereals with added vitamin D. Another way of getting vitamin D is to spend 10 minutes each day in the sun. This helps your body make its own vitamin D. Depending on your age, you may need to take a vitamin D supplement. Talk with your health care provider or dietitian about how much vitamin D you need in a supplement. Iron Healthy sources of iron include:  Dark, leafy greens. Nuts. Beans. Grain products that are fortified with iron, such as cereals. Tofu, tempeh, soybeans, and quinoa. To get the most iron from plant-based foods:  Eat iron-containing plant-based foods with vitamin C. For example, squeeze fresh lemon juice over cooked greens like kale, chard, or spinach, or have a glass of orange juice with your meals.  Avoid eating dairy products, coffee, or tea with iron-containing foods. Omega-3 fatty acids Good sources of omega-3 fatty acids include:  Walnuts. Flax seeds, canola oil, soybean oil, and tofu. Avocados. Olives and olive oil. Foods with added omega-3 fatty acids, such as eggs, milk, and juices. Calcium Good sources of calcium include:  Dairy products. Fortified non-dairy milk. Fortified tofu. Dark, leafy greens, such as kale, bok choy, Chinese cabbage, collard greens and mustard greens. Broccoli. Okra. Fortified breakfast cereals and fruit juices. Figs. Zinc Good sources of zinc include:  Pumpkin seeds. Legumes, such as chickpeas, kidney beans, and green peas. Wheat germ, whole grains, and fortified cereals. Mushrooms. Spinach and kale. Milk and dairy foods. Dark chocolate. Summary  Vegetarian eating is a choice made by people who prefer vegetarian eating for religious, environmental, or personal reasons. These diets can provide significant health benefits.  There are several  types of vegetarian diets, but all restrict proteins that come from animals.  It is important to make sure that you are getting enough nutrients, including protein, vitamin B12, vitamin D, iron, omega-3 fatty acids, calcium, and zinc from your diet.  If you think you are not getting the right nutrients or if you do not eat any animal products, talk with your health care provider or dietitian.    Health Maintenance, Female Adopting a healthy lifestyle and getting preventive care are important in promoting health and wellness. Ask your health care provider about:  The right schedule for you to have regular tests and exams.  Things you can do on your own to prevent diseases and keep yourself healthy. What should I know about diet, weight, and exercise? Eat a healthy diet   Eat a diet that includes plenty of vegetables, fruits, low-fat dairy products, and lean protein.  Do not eat a lot of foods that are high in solid fats, added sugars, or sodium. Maintain a healthy weight Body mass index (BMI) is used to identify weight problems. It estimates body fat based on height and weight. Your health care provider can help determine your BMI and help you  achieve or maintain a healthy weight. Get regular exercise Get regular exercise. This is one of the most important things you can do for your health. Most adults should:  Exercise for at least 150 minutes each week. The exercise should increase your heart rate and make you sweat (moderate-intensity exercise).  Do strengthening exercises at least twice a week. This is in addition to the moderate-intensity exercise.  Spend less time sitting. Even light physical activity can be beneficial. Watch cholesterol and blood lipids Have your blood tested for lipids and cholesterol at 43 years of age, then have this test every 5 years. Have your cholesterol levels checked more often if:  Your lipid or cholesterol levels are high.  You are older than 43  years of age.  You are at high risk for heart disease. What should I know about cancer screening? Depending on your health history and family history, you may need to have cancer screening at various ages. This may include screening for:  Breast cancer.  Cervical cancer.  Colorectal cancer.  Skin cancer.  Lung cancer. What should I know about heart disease, diabetes, and high blood pressure? Blood pressure and heart disease  High blood pressure causes heart disease and increases the risk of stroke. This is more likely to develop in people who have high blood pressure readings, are of African descent, or are overweight.  Have your blood pressure checked: ? Every 3-5 years if you are 11-67 years of age. ? Every year if you are 80 years old or older. Diabetes Have regular diabetes screenings. This checks your fasting blood sugar level. Have the screening done:  Once every three years after age 70 if you are at a normal weight and have a low risk for diabetes.  More often and at a younger age if you are overweight or have a high risk for diabetes. What should I know about preventing infection? Hepatitis B If you have a higher risk for hepatitis B, you should be screened for this virus. Talk with your health care provider to find out if you are at risk for hepatitis B infection. Hepatitis C Testing is recommended for:  Everyone born from [birth year] through 1965.  Anyone with known risk factors for hepatitis C. Sexually transmitted infections (STIs)  Get screened for STIs, including gonorrhea and chlamydia, if: ? You are sexually active and are younger than 43 years of age. ? You are older than 43 years of age and your health care provider tells you that you are at risk for this type of infection. ? Your sexual activity has changed since you were last screened, and you are at increased risk for chlamydia or gonorrhea. Ask your health care provider if you are at risk.  Ask your health  care provider about whether you are at high risk for HIV. Your health care provider may recommend a prescription medicine to help prevent HIV infection. If you choose to take medicine to prevent HIV, you should first get tested for HIV. You should then be tested every 3 months for as long as you are taking the medicine. Pregnancy  If you are about to stop having your period (premenopausal) and you may become pregnant, seek counseling before you get pregnant.  Take 400 to 800 micrograms (mcg) of folic acid every day if you become pregnant.  Ask for birth control (contraception) if you want to prevent pregnancy. Osteoporosis and menopause Osteoporosis is a disease in which the bones lose minerals and strength with  aging. This can result in bone fractures. If you are 43 years old or older, or if you are at risk for osteoporosis and fractures, ask your health care provider if you should:  Be screened for bone loss.  Take a calcium or vitamin D supplement to lower your risk of fractures.  Be given hormone replacement therapy (HRT) to treat symptoms of menopause. Follow these instructions at home: Lifestyle  Do not use any products that contain nicotine or tobacco, such as cigarettes, e-cigarettes, and chewing tobacco. If you need help quitting, ask your health care provider.  Do not use street drugs.  Do not share needles.  Ask your health care provider for help if you need support or information about quitting drugs. Alcohol use  Do not drink alcohol if: ? Your health care provider tells you not to drink. ? You are pregnant, may be pregnant, or are planning to become pregnant.  If you drink alcohol: ? Limit how much you use to 0-1 drink a day. ? Limit intake if you are breastfeeding.  Be aware of how much alcohol is in your drink. In the [place] one drink equals one 12 oz bottle of beer (355 mL), one 5 oz glass of wine (148 mL), or one 1 oz glass of hard liquor (44 mL). General  instructions  Schedule regular health, dental, and eye exams.  Stay current with your vaccines.  Tell your health care provider if: ? You often feel depressed. ? You have ever been abused or do not feel safe at home. Summary  Adopting a healthy lifestyle and getting preventive care are important in promoting health and wellness.  Follow your health care provider's instructions about healthy diet, exercising, and getting tested or screened for diseases.  Follow your health care provider's instructions on monitoring your cholesterol and blood pressure.

## 2019-01-03 NOTE — Assessment & Plan Note (Signed)
-  Stretching exercises to both feet -Tennis ball/cold water bottle to be rolled underneath arch of each foot -Patient to purchase arch support and heel cushioning for shoes -Tylenol/ibuprofen as needed for pain control

## 2019-01-03 NOTE — Addendum Note (Signed)
Addended by: Owens Shark, CARINA on: 01/03/2019 08:36 PM   Modules accepted: Level of Service

## 2019-01-03 NOTE — Progress Notes (Signed)
   Subjective:    Patient ID: Krystal Kim, female    DOB: 1976/02/07, 43 y.o.   MRN: [id number]   CC: Yearly physical, bilateral heel pain  HPI: Yearly physical exam: Patient reports for her yearly physical checkup.  States she has no current concerns except for some weight gain as well as pain in her bilateral heels.  She is up-to-date on her screening exams and will be getting her Pap smear with OB/GYN soon.  Bilateral heel pain: Patient reports having 1 year of bilateral, posterior heel pain in both of her feet.  She states the pain is worse in the morning when waking up and after she has been sitting for a while.  Walking around is painful for her but will eventually loosen the pain and it goes away.  Otherwise, she has tried Tylenol and ibuprofen to relieve pain.  Sitting still then becoming active again makes the pain worse.  The patient oftentimes wears "flats" for shoes without much arch or heel support.  Otherwise, she denies fevers, headaches, chest pain, shortness of breath, nausea, vomiting, abdominal pain, constipation, diarrhea, focal weaknesses, and loss of sensation.  Smoking status reviewed: Smoked occasionally in college, quit many years ago  Review of Systems: See HPI   Objective:  BP 110/70   Pulse 95   Ht 5\' 3"  (1.6 m)   Wt 237 lb 8 oz (107.7 kg)   SpO2 97%   BMI 42.07 kg/m  Vitals and nursing note reviewed  General: Pleasant female patient, no apparent distress HEENT: TM's visualized bilaterally, no cerumen appreciated in ears; no scleral icterus or conjunctival pallor, no nasal discharge, moist mucous membranes Neck: supple, non-tender, without lymphadenopathy, symmetrical range of motion Cardiac: RRR, clear S1 and S2, no murmurs appreciated Respiratory: CTA bilaterally, normal work of breathing, speaking in complete sentences Abdomen: Bowel sounds appreciated throughout Extremities: No edema, cyanosis, injury, or deformity appreciated to extremities, brisk  capillary refill, 2+ PT and DP pulses bilaterally, no particular tenderness appreciated on physical exam to heels Skin: warm and dry, no rashes noted Neuro: alert and oriented, no focal deficits  Assessment & Plan:   Annual visit for general adult medical examination without abnormal findings Patient with BMI of 42, dietary changes discussed with patient -Information regarding plant-based diet provided -Patient to follow-up next year or as needed for general health maintenance -Patient to have Pap smear with OB/GYN soon  Plantar fasciitis, bilateral -Stretching exercises to both feet -Tennis ball/cold water bottle to be rolled underneath arch of each foot -Patient to purchase arch support and heel cushioning for shoes -Tylenol/ibuprofen as needed for pain control   Return in about 1 year (around 01/03/2020), or if symptoms worsen or fail to improve.   Dr. Milus Banister [facility], PGY-2

## 2019-01-22 DIAGNOSIS — Z3040 Encounter for surveillance of contraceptives, unspecified: Secondary | ICD-10-CM | POA: Diagnosis not present

## 2019-01-22 DIAGNOSIS — Z01419 Encounter for gynecological examination (general) (routine) without abnormal findings: Secondary | ICD-10-CM | POA: Diagnosis not present

## 2019-01-22 DIAGNOSIS — Z6841 Body Mass Index (BMI) 40.0 and over, adult: Secondary | ICD-10-CM | POA: Diagnosis not present

## 2019-01-22 DIAGNOSIS — Z1231 Encounter for screening mammogram for malignant neoplasm of breast: Secondary | ICD-10-CM | POA: Diagnosis not present

## 2019-01-22 DIAGNOSIS — L732 Hidradenitis suppurativa: Secondary | ICD-10-CM | POA: Diagnosis not present

## 2019-01-22 MED FILL — FLUCONAZOLE 150 MG TABS: 150 | 1 days supply | Qty: 1 | Fill #0

## 2019-01-22 MED FILL — CEPHALEXIN 500 MG CAPSULE: 500 | 7 days supply | Qty: 28 | Fill #0

## 2019-04-25 MED FILL — CEPHALEXIN 500 MG CAPSULE: 500 | 7 days supply | Qty: 28 | Fill #0

## 2019-06-18 ENCOUNTER — Other Ambulatory Visit: Payer: 59

## 2019-06-18 ENCOUNTER — Ambulatory Visit: Payer: 59 | Attending: Internal Medicine

## 2019-06-18 DIAGNOSIS — Z20822 Contact with and (suspected) exposure to covid-19: Secondary | ICD-10-CM | POA: Diagnosis not present

## 2019-06-20 LAB — NOVEL CORONAVIRUS, NAA: SARS-CoV-2, NAA: DETECTED — AB

## 2020-02-04 DIAGNOSIS — Z01419 Encounter for gynecological examination (general) (routine) without abnormal findings: Secondary | ICD-10-CM | POA: Diagnosis not present

## 2020-02-04 DIAGNOSIS — Z6841 Body Mass Index (BMI) 40.0 and over, adult: Secondary | ICD-10-CM | POA: Diagnosis not present

## 2020-02-04 DIAGNOSIS — Z1231 Encounter for screening mammogram for malignant neoplasm of breast: Secondary | ICD-10-CM | POA: Diagnosis not present

## 2020-02-04 DIAGNOSIS — Z3040 Encounter for surveillance of contraceptives, unspecified: Secondary | ICD-10-CM | POA: Diagnosis not present

## 2020-02-04 DIAGNOSIS — N907 Vulvar cyst: Secondary | ICD-10-CM | POA: Diagnosis not present

## 2020-02-05 ENCOUNTER — Other Ambulatory Visit (HOSPITAL_COMMUNITY): Payer: Self-pay | Admitting: Obstetrics and Gynecology

## 2020-02-05 DIAGNOSIS — N907 Vulvar cyst: Secondary | ICD-10-CM | POA: Diagnosis not present

## 2020-03-16 DIAGNOSIS — H5203 Hypermetropia, bilateral: Secondary | ICD-10-CM | POA: Diagnosis not present

## 2020-06-23 MED FILL — CEPHALEXIN 250 MG CAPS: 250 | 5 days supply | Qty: 30 | Fill #0

## 2020-09-16 ENCOUNTER — Other Ambulatory Visit (HOSPITAL_COMMUNITY): Payer: Self-pay

## 2020-09-16 ENCOUNTER — Telehealth: Payer: 59 | Admitting: Physician Assistant

## 2020-09-16 DIAGNOSIS — B9689 Other specified bacterial agents as the cause of diseases classified elsewhere: Secondary | ICD-10-CM

## 2020-09-16 DIAGNOSIS — J019 Acute sinusitis, unspecified: Secondary | ICD-10-CM | POA: Diagnosis not present

## 2020-09-16 MED ORDER — DOXYCYCLINE HYCLATE 100 MG PO TABS
100.0000 mg | ORAL_TABLET | Freq: Two times a day (BID) | ORAL | 0 refills | Status: DC
  Filled 2020-09-16: qty 20, 10d supply, fill #0

## 2020-09-16 NOTE — Progress Notes (Signed)

## 2020-09-16 NOTE — Progress Notes (Signed)
I have spent 5 minutes in review of e-visit questionnaire, review and updating patient chart, medical decision making and response to patient.   Gio Janoski [place], PA-C    

## 2020-10-06 ENCOUNTER — Encounter: Payer: 59 | Admitting: Family Medicine

## 2021-04-05 DIAGNOSIS — Z3040 Encounter for surveillance of contraceptives, unspecified: Secondary | ICD-10-CM | POA: Diagnosis not present

## 2021-04-05 DIAGNOSIS — Z6841 Body Mass Index (BMI) 40.0 and over, adult: Secondary | ICD-10-CM | POA: Diagnosis not present

## 2021-04-05 DIAGNOSIS — Z01419 Encounter for gynecological examination (general) (routine) without abnormal findings: Secondary | ICD-10-CM | POA: Diagnosis not present

## 2021-04-05 DIAGNOSIS — Z1211 Encounter for screening for malignant neoplasm of colon: Secondary | ICD-10-CM | POA: Diagnosis not present

## 2021-04-05 DIAGNOSIS — Z1231 Encounter for screening mammogram for malignant neoplasm of breast: Secondary | ICD-10-CM | POA: Diagnosis not present

## 2021-05-17 DIAGNOSIS — K5900 Constipation, unspecified: Secondary | ICD-10-CM | POA: Diagnosis not present

## 2021-05-17 DIAGNOSIS — Z1211 Encounter for screening for malignant neoplasm of colon: Secondary | ICD-10-CM | POA: Diagnosis not present

## 2021-05-17 DIAGNOSIS — K219 Gastro-esophageal reflux disease without esophagitis: Secondary | ICD-10-CM | POA: Diagnosis not present

## 2021-05-17 DIAGNOSIS — K2100 Gastro-esophageal reflux disease with esophagitis, without bleeding: Secondary | ICD-10-CM | POA: Diagnosis not present

## 2021-06-01 ENCOUNTER — Encounter: Payer: Self-pay | Admitting: Internal Medicine

## 2021-06-02 ENCOUNTER — Other Ambulatory Visit: Payer: Self-pay

## 2021-06-02 ENCOUNTER — Ambulatory Visit (INDEPENDENT_AMBULATORY_CARE_PROVIDER_SITE_OTHER): Payer: 59 | Admitting: Internal Medicine

## 2021-06-02 ENCOUNTER — Encounter: Payer: Self-pay | Admitting: Internal Medicine

## 2021-06-02 VITALS — BP 120/82 | HR 92 | Ht 63.0 in | Wt 257.2 lb

## 2021-06-02 DIAGNOSIS — R7989 Other specified abnormal findings of blood chemistry: Secondary | ICD-10-CM | POA: Diagnosis not present

## 2021-06-02 LAB — T4, FREE: Free T4: 0.53 ng/dL — ABNORMAL LOW (ref 0.60–1.60)

## 2021-06-02 LAB — T3, FREE: T3, Free: 3.4 pg/mL (ref 2.3–4.2)

## 2021-06-02 LAB — TSH: TSH: 7.87 u[IU]/mL — ABNORMAL HIGH (ref 0.35–5.50)

## 2021-06-02 NOTE — Patient Instructions (Signed)
Please stop at the lab.  Please return in 3-4 months.  Hypothyroidism Hypothyroidism is when the thyroid gland does not make enough of certain hormones (it is underactive). The thyroid gland is a small gland located in the lower front part of the neck, just in front of the windpipe (trachea). This gland makes hormones that help control how the body uses food for energy (metabolism) as well as how the heart and brain function. These hormones also play a role in keeping your bones strong. When the thyroid is underactive, it produces too little of the hormones thyroxine (T4) and triiodothyronine (T3). What are the causes? This condition may be caused by: Hashimoto's disease. This is a disease in which the body's disease-fighting system (immune system) attacks the thyroid gland. This is the most common cause. Viral infections. Pregnancy. Certain medicines. Birth defects. Past radiation treatments to the head or neck for cancer. Past treatment with radioactive iodine. Past exposure to radiation in the environment. Past surgical removal of part or all of the thyroid. Problems with a gland in the center of the brain (pituitary gland). Lack of enough iodine in the diet. What increases the risk? You are more likely to develop this condition if: You are female. You have a family history of thyroid conditions. You use a medicine called lithium. You take medicines that affect the immune system (immunosuppressants). What are the signs or symptoms? Symptoms of this condition include: Feeling as though you have no energy (lethargy). Not being able to tolerate cold. Weight gain that is not explained by a change in diet or exercise habits. Lack of appetite. Dry skin. Coarse hair. Menstrual irregularity. Slowing of thought processes. Constipation. Sadness or depression. How is this diagnosed? This condition may be diagnosed based on: Your symptoms, your medical history, and a physical  exam. Blood tests. You may also have imaging tests, such as an ultrasound or MRI. How is this treated? This condition is treated with medicine that replaces the thyroid hormones that your body does not make. After you begin treatment, it may take several weeks for symptoms to go away. Follow these instructions at home: Take over-the-counter and prescription medicines only as told by your health care provider. If you start taking any new medicines, tell your health care provider. Keep all follow-up visits as told by your health care provider. This is important. As your condition improves, your dosage of thyroid hormone medicine may change. You will need to have blood tests regularly so that your health care provider can monitor your condition. Contact a health care provider if: Your symptoms do not get better with treatment. You are taking thyroid hormone replacement medicine and you: Sweat a lot. Have tremors. Feel anxious. Lose weight rapidly. Cannot tolerate heat. Have emotional swings. Have diarrhea. Feel weak. Get help right away if you have: Chest pain. An irregular heartbeat. A rapid heartbeat. Difficulty breathing. Summary Hypothyroidism is when the thyroid gland does not make enough of certain hormones (it is underactive). When the thyroid is underactive, it produces too little of the hormones thyroxine (T4) and triiodothyronine (T3). The most common cause is Hashimoto's disease, a disease in which the body's disease-fighting system (immune system) attacks the thyroid gland. The condition can also be caused by viral infections, medicine, pregnancy, or past radiation treatment to the head or neck. Symptoms may include weight gain, dry skin, constipation, feeling as though you do not have energy, and not being able to tolerate cold. This condition is treated with medicine to  replace the thyroid hormones that your body does not make. This information is not intended to replace  advice given to you by your health care provider. Make sure you discuss any questions you have with your health care provider. Document Revised: 02/03/2021 Document Reviewed: 02/12/2020 Elsevier Patient Education  2022 [employer].

## 2021-06-02 NOTE — Progress Notes (Signed)
Patient ID: ALISEN MARSIGLIA, female   DOB: [birth year], 45 y.o.   MRN: [id number]  This visit occurred during the SARS-CoV-2 public health emergency.  Safety protocols were in place, including screening questions prior to the visit, additional usage of staff PPE, and extensive cleaning of exam room while observing appropriate contact time as indicated for disinfecting solutions.  HPI  ONETTA SPAINHOWER is a 44 y.o.-year-old female, referred by her gastroenterologist, Dr.Mann, for management of an elevated TSH.  Patient was recently seen by Dr. Loreta Ave for a screening colonoscopy.  She did mention occasional constipation.  During investigation for this, she was found to have a high TSH.  She was referred to endocrinology.  Patient mentions a h/o large weight fluctuations -she was able to lose weight in the past on a low calorie diet >> 60-90 lbs, however, she gained that back easily.  She has fatigue (insomnia): works a full time job and has 4 children.   She has a distant history of regular menstrual cycles and heavy bleeding.  She was on OCPs since a teenager.  She developed a DVT and PE (on OCPs) in 2017 >> could not exercise, relaxed diet >> gained weight.  Now on Pg IUD.  No depression. Some hair thinning. No cold intolerance.  I reviewed pt's thyroid tests: 05/18/2021: TSH 10.1 No results found for: TSH, FREET4, T3FREE  Antithyroid antibodies: No results found for: THGAB No components found for: TPOAB  Pt denies feeling nodules in neck, hoarseness, dysphagia/odynophagia, SOB with lying down.  She has no FH of thyroid disorders.No FH of thyroid cancer.  No h/o radiation tx to head or neck. No recent use of iodine supplements.  ROS: Constitutional: + See HPI Eyes: + blurry vision (needs readers) ENT: no sore throat, no nodules palpated in neck, no dysphagia/odynophagia, no hoarseness Cardiovascular: no CP/SOB/palpitations/leg swelling Respiratory: no cough/SOB Gastrointestinal: no  N/V/D/C Musculoskeletal: no muscle/joint aches Skin: no rashes Neurological: no tremors/numbness/tingling/dizziness, + occasional headaches Psychiatric: no depression/anxiety + Low libido  Past Medical History:  Diagnosis Date   Anemia    Obesity    PE (pulmonary embolism) 10/2015   PONV (postoperative nausea and vomiting)    Seasonal allergies    Past Surgical History:  Procedure Laterality Date   CESAREAN SECTION     CESAREAN SECTION N/A 01/19/2014   Procedure: REPEAT CESAREAN SECTION;  Surgeon: Esmeralda Arthur, MD;  Location: [facility];  Service: Obstetrics;  Laterality: N/A;   VAGINA RECONSTRUCTION SURGERY     Social History   Socioeconomic History   Marital status: Married    Spouse name: Not on file   Number of children: 4   Years of education: Not on file   Highest education level: Not on file  Occupational History   Occupation: Interior and spatial designer of finance  Tobacco Use   Smoking status: Never   Smokeless tobacco: Never  Substance and Sexual Activity   Alcohol use: Yes    Comment: Occasionally   Drug use: No   Sexual activity: Yes    Birth control/protection: None  Other Topics Concern   Not on file  Social History Narrative   Lives with husband and 3 children.  Works in Audiological scientist at [employer].   Social Determinants of Health   Financial Resource Strain: Not on file  Food Insecurity: Not on file  Transportation Needs: Not on file  Physical Activity: Not on file  Stress: Not on file  Social Connections: Not on file  Intimate Partner Violence: Not on  file   Current Outpatient Medications on File Prior to Visit  Medication Sig Dispense Refill   levonorgestrel (MIRENA, 52 MG,) 20 MCG/DAY IUD Mirena     No current facility-administered medications on file prior to visit.   Allergies  Allergen Reactions   Codeine     REACTION: Nausea and vomiting   Family History  Problem Relation Age of Onset   Diabetes Father    Hypertension Son    Diabetes Maternal Aunt     Heart disease Maternal Grandmother    PE: BP 120/82 (BP Location: Right Arm, Patient Position: Sitting, Cuff Size: Normal)    Pulse 92    Ht 5\' 3"  (1.6 m)    Wt 257 lb 3.2 oz (116.7 kg)    SpO2 99%    BMI 45.56 kg/m  Wt Readings from Last 3 Encounters:  06/02/21 257 lb 3.2 oz (116.7 kg)  01/03/19 237 lb 8 oz (107.7 kg)  03/07/18 233 lb 6.4 oz (105.9 kg)   Constitutional: overweight, in NAD Eyes: PERRLA, EOMI, no exophthalmos ENT: moist mucous membranes, no thyromegaly, no cervical lymphadenopathy Cardiovascular: tachycardia, RR, No MRG Respiratory: CTA B Gastrointestinal: abdomen soft, NT, ND, BS+ Musculoskeletal: no deformities, strength intact in all 4 Skin: moist, warm, no rashes Neurological: no tremor with outstretched hands, DTR normal in all 4  ASSESSMENT: 1. Hypothyroidism  PLAN:  1. Patient with newly found elevated TSH during investigation for constipation by Dr. 03/09/18. - she appears euthyroid but does describe weight fluctuations and difficulty losing weight. - she does not appear to have a goiter, thyroid nodules, or neck compression symptoms - at this visit we discussed about hypothyroidism in general including possible causes, possible associated symptoms, and also treatment - we discussed about rechecking her thyroid tests today along with thyroid antibodies to investigate for Hashimoto's thyroiditis - we discussed about Hashimoto thyroiditis, which is the most common cause of hypothyroidism in [place]. I explained that this is an autoimmune disorder, in which she develops antibodies against her own thyroid. The antibodies bind to the thyroid tissue and cause inflammation, and, eventually, destruction of the gland and hypothyroidism. I also explained that thyroid enlargement especially at the beginning of her Hashimoto thyroiditis course is not uncommon, and it has a waxing and waning character. We discussed about treatment for Hashimoto thyroiditis, which is actually limited to  thyroid hormones in case her TFTs are abnormal. Supplements like selenium has been tried with various results, some showing improvement in the TPO antibodies. However, there are no randomized controlled trials of this are consistent results between trials. We also discussed about ways to improve her immune system (relaxation, diet, exercise, sleep) to reduce the Ab titer and, subsequently, the thyroid inflammation. - we discussed about how to take levothyroxine correctly in case she needs to start: fasting, with water, separated by at least 30 minutes from breakfast, and separated by more than 4 hours from calcium, iron, multivitamins, acid reflux medications (PPIs). - will check thyroid tests today: TSH, free T4, free T3 and will add TPO and ATA Abs - If labs today are abnormal, she will need to return in ~5-6 weeks for repeat labs - Otherwise, I will see her back in 3-4 months  Component     Latest Ref Rng & Units 06/02/2021  TSH     0.35 - 5.50 uIU/mL 7.87 (H)  T4,Free(Direct)     0.60 - 1.60 ng/dL 06/04/2021 (L)  Triiodothyronine,Free,Serum     2.3 - 4.2 pg/mL 3.4  Thyroglobulin Ab     < or = 1 IU/mL 63 (H)  Thyroperoxidase Ab SerPl-aCnc     <9 IU/mL 27 (H)   TSH is still high, slightly lower than before, but still above target.  Free T4 also low.  She has high TPO and ATA antibodies, confirming Hashimoto's thyroiditis.  At this point, we have 2 options:  -Try to start low-dose levothyroxine, 25 mcg daily and recheck the thyroid tests in 5 to 6 weeks -Start selenium 200 mcg daily and recheck the tests in 5 to 6 weeks.  At that time, if TFTs not normal, we can start levothyroxine.  Discussed with the patient about the above options >> she preferred to start LT4 25 mcg daily and recheck the TFTs in 5-6 weeks.  Carlus Pavlov, MD PhD [facility]

## 2021-06-03 LAB — THYROID PEROXIDASE ANTIBODY: Thyroperoxidase Ab SerPl-aCnc: 27 IU/mL — ABNORMAL HIGH (ref <9)

## 2021-06-03 LAB — THYROGLOBULIN ANTIBODY: Thyroglobulin Ab: 63 IU/mL — ABNORMAL HIGH (ref <=1)

## 2021-06-07 ENCOUNTER — Encounter: Payer: Self-pay | Admitting: Internal Medicine

## 2021-06-07 ENCOUNTER — Other Ambulatory Visit (HOSPITAL_COMMUNITY): Payer: Self-pay

## 2021-06-07 DIAGNOSIS — R7989 Other specified abnormal findings of blood chemistry: Secondary | ICD-10-CM

## 2021-06-07 MED ORDER — LEVOTHYROXINE SODIUM 25 MCG PO TABS
25.0000 ug | ORAL_TABLET | Freq: Every day | ORAL | 1 refills | Status: DC
  Filled 2021-06-07: qty 45, 45d supply, fill #0
  Filled 2021-07-20: qty 45, 45d supply, fill #1

## 2021-06-08 ENCOUNTER — Other Ambulatory Visit (HOSPITAL_COMMUNITY): Payer: Self-pay

## 2021-06-10 DIAGNOSIS — R7989 Other specified abnormal findings of blood chemistry: Secondary | ICD-10-CM | POA: Insufficient documentation

## 2021-07-13 ENCOUNTER — Other Ambulatory Visit: Payer: Self-pay

## 2021-07-13 ENCOUNTER — Other Ambulatory Visit: Payer: Self-pay | Admitting: Internal Medicine

## 2021-07-13 ENCOUNTER — Other Ambulatory Visit (INDEPENDENT_AMBULATORY_CARE_PROVIDER_SITE_OTHER): Payer: 59

## 2021-07-13 DIAGNOSIS — R7989 Other specified abnormal findings of blood chemistry: Secondary | ICD-10-CM

## 2021-07-13 LAB — TSH: TSH: 4.27 u[IU]/mL (ref 0.35–5.50)

## 2021-07-13 LAB — T4, FREE: Free T4: 0.87 ng/dL (ref 0.60–1.60)

## 2021-07-20 ENCOUNTER — Other Ambulatory Visit (HOSPITAL_COMMUNITY): Payer: Self-pay

## 2021-07-29 ENCOUNTER — Other Ambulatory Visit (HOSPITAL_COMMUNITY): Payer: Self-pay

## 2021-07-29 MED ORDER — CLENPIQ 10-3.5-12 MG-GM -GM/160ML PO SOLN
ORAL | 0 refills | Status: DC
  Filled 2021-07-29 – 2021-08-08 (×3): qty 320, 1d supply, fill #0

## 2021-08-08 ENCOUNTER — Other Ambulatory Visit (HOSPITAL_COMMUNITY): Payer: Self-pay

## 2021-09-02 ENCOUNTER — Other Ambulatory Visit: Payer: Self-pay

## 2021-09-02 ENCOUNTER — Encounter: Payer: Self-pay | Admitting: Internal Medicine

## 2021-09-02 ENCOUNTER — Ambulatory Visit (INDEPENDENT_AMBULATORY_CARE_PROVIDER_SITE_OTHER): Payer: 59 | Admitting: Internal Medicine

## 2021-09-02 VITALS — BP 120/72 | HR 84 | Ht 63.0 in | Wt 246.6 lb

## 2021-09-02 DIAGNOSIS — E038 Other specified hypothyroidism: Secondary | ICD-10-CM | POA: Diagnosis not present

## 2021-09-02 DIAGNOSIS — E063 Autoimmune thyroiditis: Secondary | ICD-10-CM | POA: Diagnosis not present

## 2021-09-02 DIAGNOSIS — R7989 Other specified abnormal findings of blood chemistry: Secondary | ICD-10-CM | POA: Diagnosis not present

## 2021-09-02 NOTE — Progress Notes (Signed)
Patient ID: Krystal Kim, female   DOB: 1975/07/15, 46 y.o.   MRN: [id number] ? ?This visit occurred during the SARS-CoV-2 public health emergency.  Safety protocols were in place, including screening questions prior to the visit, additional usage of staff PPE, and extensive cleaning of exam room while observing appropriate contact time as indicated for disinfecting solutions. ? ?HPI  ?Krystal Kim is a 46 y.o.-year-old female, initially referred by her gastroenterologist, Dr.Mann, returning for follow-up for Hashimoto's hypothyroidism, diagnosed at last visit.  She previously had an elevated TSH. ? ?Interim history: ?After starting levothyroxine at last visit, she did not feel different after starting this. ?She had a URI last month. ? ?Reviewed history: ?Patient was seen by Dr. Loreta AveMann in 05/2021 for a screening colonoscopy.  At that time, she did mention occasional constipation.  During investigation for this, she was found to have a high TSH.  She was referred to endocrinology. ? ?Patient mentions a h/o large weight fluctuations -she was able to lose weight in the past on a low calorie diet >> 60-90 lbs, however, she gained that back easily. ? ?She has fatigue (insomnia): works a full time job and has 4 children.  ? ?She has a distant history of regular menstrual cycles and heavy bleeding.  She was on OCPs since a teenager.  She developed a DVT and PE (on OCPs) in 2017 >> could not exercise, relaxed diet >> gained weight.  Now on Pg IUD. ? ?No depression. Some hair thinning. No cold intolerance. ? ?We started levothyroxine 25 mcg daily in 05/2021.  She takes this: ?- in am ?- fasting ?- at least 30 min from b'fast ?- no calcium ?- no iron ?- no multivitamins ?- no PPIs ?- not on Biotin ? ?I reviewed pt's thyroid tests: ?Lab Results  ?Component Value Date  ? TSH 4.27 07/13/2021  ? TSH 7.87 (H) 06/02/2021  ? FREET4 0.87 07/13/2021  ? FREET4 0.53 (L) 06/02/2021  ? T3FREE 3.4 06/02/2021  ?05/18/2021: TSH  10.1 ? ?Antithyroid antibodies: ?Component ?    Latest Ref Rng 06/02/2021  ?Thyroglobulin Ab ?    < or = 1 IU/mL 63 (H)   ?Thyroperoxidase Ab SerPl-aCnc ?    <9 IU/mL 27 (H)   ? ?Pt denies feeling nodules in neck, hoarseness, dysphagia/odynophagia, SOB with lying down. ? ?She has no FH of thyroid disorders.No FH of thyroid cancer.  ?No h/o radiation tx to head or neck. ?No recent use of iodine supplements. ? ?ROS: ?Constitutional: + See HPI ? ?Past Medical History:  ?Diagnosis Date  ? Anemia   ? Obesity   ? PE (pulmonary embolism) 10/2015  ? PONV (postoperative nausea and vomiting)   ? Seasonal allergies   ? ?Past Surgical History:  ?Procedure Laterality Date  ? CESAREAN SECTION    ? CESAREAN SECTION N/A 01/19/2014  ? Procedure: REPEAT CESAREAN SECTION;  Surgeon: Esmeralda ArthurSandra A Rivard, MD;  Location: [facility];  Service: Obstetrics;  Laterality: N/A;  ? VAGINA RECONSTRUCTION SURGERY    ? ?Social History  ? ?Socioeconomic History  ? Marital status: Married  ?  Spouse name: Not on file  ? Number of children: 4  ? Years of education: Not on file  ? Highest education level: Not on file  ?Occupational History  ? Occupation: Airline pilotDirector of finance  ?Tobacco Use  ? Smoking status: Never  ? Smokeless tobacco: Never  ?Substance and Sexual Activity  ? Alcohol use: Yes  ?  Comment: Occasionally  ? Drug  use: No  ? Sexual activity: Yes  ?  Birth control/protection: None  ?Other Topics Concern  ? Not on file  ?Social History Narrative  ? Lives with husband and 3 children.  Works in Audiological scientist at [employer].  ? ?Social Determinants of Health  ? ?Financial Resource Strain: Not on file  ?Food Insecurity: Not on file  ?Transportation Needs: Not on file  ?Physical Activity: Not on file  ?Stress: Not on file  ?Social Connections: Not on file  ?Intimate Partner Violence: Not on file  ? ?Current Outpatient Medications on File Prior to Visit  ?Medication Sig Dispense Refill  ? levonorgestrel (MIRENA, 52 MG,) 20 MCG/DAY IUD Mirena    ? levothyroxine  (SYNTHROID) 25 MCG tablet Take 1 tablet by mouth daily before breakfast. 45 tablet 1  ? Sod Picosulfate-Mag Ox-Cit Acd (CLENPIQ) 10-3.5-12 MG-GM -GM/160ML SOLN Take 160 mL by mouth twice  as directed by instructions from office. 320 mL 0  ? ?No current facility-administered medications on file prior to visit.  ? ?Allergies  ?Allergen Reactions  ? Codeine   ?  REACTION: Nausea and vomiting  ? ?Family History  ?Problem Relation Age of Onset  ? Diabetes Father   ? Diabetes Maternal Aunt   ? Heart disease Maternal Grandmother   ? Hypertension Paternal Grandfather   ? Heart disease Paternal Grandfather   ? Hypertension Son   ? ?PE: ?BP 120/72 (BP Location: Left Arm, Patient Position: Sitting, Cuff Size: Normal)   Pulse 84   Ht 5\' 3"  (1.6 m)   Wt 246 lb 9.6 oz (111.9 kg)   SpO2 98%   BMI 43.68 kg/m?  ?Wt Readings from Last 3 Encounters:  ?09/02/21 246 lb 9.6 oz (111.9 kg)  ?06/02/21 257 lb 3.2 oz (116.7 kg)  ?01/03/19 237 lb 8 oz (107.7 kg)  ? ?Constitutional: overweight, in NAD ?Eyes: PERRLA, EOMI, no exophthalmos ?ENT: moist mucous membranes, no thyromegaly, no cervical lymphadenopathy ?Cardiovascular: tachycardia, RR, No MRG ?Respiratory: CTA B ?Musculoskeletal: no deformities, strength intact in all 4 ?Skin: moist, warm, no rashes ?Neurological: no tremor with outstretched hands, DTR normal in all 4 ? ?ASSESSMENT: ?1. Hypothyroidism due to Hashimoto's thyroiditis ? ?PLAN:  ?1. Patient with history of elevated TSH during investigation for constipation by Dr. 01/05/19. ?-At our first visit 3 months ago, she appears euthyroid but did describe weight fluctuations and difficulty losing weight ?-No goiter, thyroid nodules, or neck compression symptoms ?-At last visit, we rechecked her TSH and this was elevated and the free T4 was low.  Also, she had positive thyroglobulin and TPO antibodies, confirming the diagnosis of Hashimoto's thyroiditis ?-At today's visit, we discussed about her diagnosis of autoimmune  hypothyroidism. ?-At last visit, we started levothyroxine 25 mcg daily ?-latest thyroid labs reviewed with pt. >> normal after starting levothyroxine: ?Lab Results  ?Component Value Date  ? TSH 4.27 07/13/2021  ?-she continues on LT4 25 mcg daily ?-pt tells me she does not feel differently after starting levothyroxine.  However, reviewing her weight, she lost 11 pounds since last visit. ?-we discussed about taking the thyroid hormone every day, with water, >30 minutes before breakfast, separated by >4 hours from acid reflux medications, calcium, iron, multivitamins. Pt. is taking it correctly. ?-will check thyroid tests today: TSH and fT4 and will try to optimize the levothyroxine dose for a TSH in the middle of the normal range. ?-If labs are abnormal, she will need to return for repeat TFTs in 1.5 months ?-Otherwise, I will see her back  in 6 months ? ?Need refills. ? ?Component ?    Latest Ref Rng 09/02/2021  ?TSH ?    0.35 - 5.50 uIU/mL 6.29 (H)   ?T4,Free(Direct) ?    0.60 - 1.60 ng/dL 3.29   ?  ?TSH is elevated -we will need to increase levothyroxine to 50 mcg daily and repeat her TFTs in 1.5 months. ? ?Carlus Pavlov, MD PhD ?[facility] Endocrin[facility] ? ? ?

## 2021-09-02 NOTE — Patient Instructions (Addendum)
Please continue levothyroxine 25 mcg daily. ? ?Take the thyroid hormone every day, with water, at least 30 minutes before breakfast, separated by at least 4 hours from: ?- acid reflux medications ?- calcium ?- iron ?- multivitamins ? ?Please stop at the lab. ? ?Please come back in 6 months, but possibly sooner for labs. ?

## 2021-09-06 ENCOUNTER — Other Ambulatory Visit (HOSPITAL_COMMUNITY): Payer: Self-pay

## 2021-09-06 LAB — TSH: TSH: 6.29 u[IU]/mL — ABNORMAL HIGH (ref 0.35–5.50)

## 2021-09-06 LAB — T4, FREE: Free T4: 0.68 ng/dL (ref 0.60–1.60)

## 2021-09-06 MED ORDER — LEVOTHYROXINE SODIUM 50 MCG PO TABS
50.0000 ug | ORAL_TABLET | Freq: Every day | ORAL | 3 refills | Status: DC
  Filled 2021-09-06: qty 45, 45d supply, fill #0
  Filled 2021-10-11: qty 45, 45d supply, fill #1
  Filled 2021-11-28: qty 45, 45d supply, fill #2
  Filled 2022-01-16: qty 45, 45d supply, fill #3

## 2021-10-12 ENCOUNTER — Other Ambulatory Visit (HOSPITAL_COMMUNITY): Payer: Self-pay

## 2021-10-19 ENCOUNTER — Other Ambulatory Visit (INDEPENDENT_AMBULATORY_CARE_PROVIDER_SITE_OTHER): Payer: 59

## 2021-10-19 DIAGNOSIS — E038 Other specified hypothyroidism: Secondary | ICD-10-CM | POA: Diagnosis not present

## 2021-10-19 DIAGNOSIS — E063 Autoimmune thyroiditis: Secondary | ICD-10-CM

## 2021-10-19 LAB — TSH: TSH: 4.71 u[IU]/mL (ref 0.35–5.50)

## 2021-10-19 LAB — T4, FREE: Free T4: 0.83 ng/dL (ref 0.60–1.60)

## 2021-11-15 ENCOUNTER — Encounter: Payer: Self-pay | Admitting: *Deleted

## 2021-11-28 ENCOUNTER — Other Ambulatory Visit (HOSPITAL_COMMUNITY): Payer: Self-pay

## 2022-01-10 ENCOUNTER — Ambulatory Visit (INDEPENDENT_AMBULATORY_CARE_PROVIDER_SITE_OTHER): Payer: 59 | Admitting: Family Medicine

## 2022-01-10 ENCOUNTER — Encounter: Payer: Self-pay | Admitting: Family Medicine

## 2022-01-10 VITALS — BP 118/81 | HR 92 | Ht 63.0 in | Wt 247.0 lb

## 2022-01-10 DIAGNOSIS — R7989 Other specified abnormal findings of blood chemistry: Secondary | ICD-10-CM | POA: Diagnosis not present

## 2022-01-10 DIAGNOSIS — Z0000 Encounter for general adult medical examination without abnormal findings: Secondary | ICD-10-CM | POA: Diagnosis not present

## 2022-01-10 NOTE — Progress Notes (Signed)
    SUBJECTIVE:   CHIEF COMPLAINT / HPI:   Patient presents for a physical, she is concerned about her weight and her father has a history of diabetes so she is also worried about this. She struggles with finding time to exercise, attempts to do a lot of walking. Most of the time she makes good decisions with eating with the exception of Sunday family dinners or vacation times. Has always struggled with her weight and her goal is to be more healthy. She feels like her weight contributes to her decreased energy and fatigue. Her weight has fluctuated throughout her life. Works about 60 hours a week and has 4 children so very busy with balancing work and personal life. Eats take out often and tries to have healthy options because she does not always have time to prepare meals at home.   PERTINENT  PMH / PSH:   Patient also has history of thyroid disorder, follows with endocrine. Most recent TSH 4.71 and T4 0.83. Compliant on synthroid.   OBJECTIVE:   BP 118/81   Pulse 92   Ht 5\' 3"  (1.6 m)   Wt 247 lb (112 kg)   SpO2 100%   BMI 43.75 kg/m    General: Patient well-appearing, in no acute distress. HEENT: PERRLA, non-tender thyroid, no evidence of cervical LAD CV: RRR, no murmurs or gallops auscultated Resp: CTAB, no wheezing, rales or rhonchi  Abdomen: soft, nontender, nondistended, presence of bowel sounds Ext: no LE edema noted, radial and distal pulses strong and equal bilaterally  Psych: mood appropriate    ASSESSMENT/PLAN:   High serum thyroid stimulating hormone (TSH) -has recent TSH and T4, endocrine to recheck in September per patient -continue synthroid  -continue to follow with endocrinologist    Health maintenance -med rec reviewed and updated appropriately  -PAP performed by OB/GYN, up to date -Mammogram performed in Oct, gets it yearly by OB/GYN as well  -Had colonoscopy rescheduled 3 times prior but follows up with GI so does not need referral, instructed to  reschedule at her earliest convenience, patient agrees.  -Extensively discussed lifestyle modifications, importance of balanced diet and regular exercise and ways to implement these into her busy life schedule. Her goals include not developing diabetes. Pending A1c, lipid panel, BMP and Hep C screening.  -Follow up in 3 months to discuss lifestyle modifications    -PHQ-9 score of 1 with negative question 9 reviewed.   Nov, DO [facility]

## 2022-01-10 NOTE — Assessment & Plan Note (Signed)
-  has recent TSH and T4, endocrine to recheck in September per patient -continue synthroid  -continue to follow with endocrinologist

## 2022-01-10 NOTE — Patient Instructions (Signed)
It was great seeing you today!  Today we have a physical and discussed healthy lifestyle changes. I recommend exercising at least 90 minutes a week and eating a balanced diet with vegetables and lean protein (chicken, [place] or fish).   Please follow up at your next scheduled appointment in 3 months, if anything arises between now and then, please don't hesitate to contact our office.   Thank you for allowing [place] to be a part of your medical care!  Thank you, Dr. Robyne Peers

## 2022-01-17 ENCOUNTER — Other Ambulatory Visit: Payer: 59

## 2022-01-17 ENCOUNTER — Other Ambulatory Visit (HOSPITAL_COMMUNITY): Payer: Self-pay

## 2022-01-18 ENCOUNTER — Other Ambulatory Visit (HOSPITAL_COMMUNITY): Payer: Self-pay

## 2022-01-20 ENCOUNTER — Other Ambulatory Visit: Payer: 59

## 2022-01-20 DIAGNOSIS — Z0000 Encounter for general adult medical examination without abnormal findings: Secondary | ICD-10-CM | POA: Diagnosis not present

## 2022-01-21 LAB — BASIC METABOLIC PANEL
BUN/Creatinine Ratio: 14 (ref 9–23)
BUN: 11 mg/dL (ref 6–24)
CO2: 20 mmol/L (ref 20–29)
Calcium: 9.6 mg/dL (ref 8.7–10.2)
Chloride: 103 mmol/L (ref 96–106)
Creatinine, Ser: 0.8 mg/dL (ref 0.57–1.00)
Glucose: 94 mg/dL (ref 70–99)
Potassium: 5.1 mmol/L (ref 3.5–5.2)
Sodium: 139 mmol/L (ref 134–144)
eGFR: 92 mL/min/{1.73_m2} (ref >59)

## 2022-01-21 LAB — LIPID PANEL
Chol/HDL Ratio: 4.2 ratio (ref 0.0–4.4)
Cholesterol, Total: 204 mg/dL — ABNORMAL HIGH (ref 100–199)
HDL: 49 mg/dL (ref >39)
LDL Chol Calc (NIH): 126 mg/dL — ABNORMAL HIGH (ref 0–99)
Triglycerides: 161 mg/dL — ABNORMAL HIGH (ref 0–149)
VLDL Cholesterol Cal: 29 mg/dL (ref 5–40)

## 2022-01-21 LAB — HCV AB W REFLEX TO QUANT PCR: HCV Ab: NONREACTIVE

## 2022-01-21 LAB — HEMOGLOBIN A1C
Est. average glucose Bld gHb Est-mCnc: 108 mg/dL
Hgb A1c MFr Bld: 5.4 % (ref 4.8–5.6)

## 2022-01-21 LAB — HCV INTERPRETATION

## 2022-02-23 ENCOUNTER — Other Ambulatory Visit: Payer: Self-pay | Admitting: Internal Medicine

## 2022-02-23 DIAGNOSIS — R7989 Other specified abnormal findings of blood chemistry: Secondary | ICD-10-CM

## 2022-02-28 ENCOUNTER — Encounter: Payer: Self-pay | Admitting: Internal Medicine

## 2022-02-28 ENCOUNTER — Ambulatory Visit (INDEPENDENT_AMBULATORY_CARE_PROVIDER_SITE_OTHER): Payer: 59 | Admitting: Internal Medicine

## 2022-02-28 ENCOUNTER — Other Ambulatory Visit (HOSPITAL_COMMUNITY): Payer: Self-pay

## 2022-02-28 VITALS — BP 120/78 | HR 70 | Ht 63.0 in | Wt 250.8 lb

## 2022-02-28 DIAGNOSIS — E038 Other specified hypothyroidism: Secondary | ICD-10-CM | POA: Insufficient documentation

## 2022-02-28 DIAGNOSIS — R7989 Other specified abnormal findings of blood chemistry: Secondary | ICD-10-CM

## 2022-02-28 DIAGNOSIS — E063 Autoimmune thyroiditis: Secondary | ICD-10-CM

## 2022-02-28 LAB — T4, FREE: Free T4: 0.61 ng/dL (ref 0.60–1.60)

## 2022-02-28 LAB — TSH: TSH: 7.04 u[IU]/mL — ABNORMAL HIGH (ref 0.35–5.50)

## 2022-02-28 MED ORDER — LEVOTHYROXINE SODIUM 75 MCG PO TABS
75.0000 ug | ORAL_TABLET | Freq: Every day | ORAL | 5 refills | Status: DC
  Filled 2022-02-28: qty 45, 45d supply, fill #0
  Filled 2022-04-10: qty 45, 45d supply, fill #1
  Filled 2022-05-23 – 2022-05-26 (×3): qty 45, 45d supply, fill #2
  Filled 2022-07-09: qty 45, 45d supply, fill #3
  Filled 2022-08-28: qty 45, 45d supply, fill #4
  Filled 2022-10-13: qty 45, 45d supply, fill #5

## 2022-02-28 NOTE — Patient Instructions (Signed)
Please continue levothyroxine 50 mcg daily.  Take the thyroid hormone every day, with water, at least 30 minutes before breakfast, separated by at least 4 hours from: - acid reflux medications - calcium - iron - multivitamins  Please stop at the lab.  Please come back in 1 months, but in 6 mo for labs.

## 2022-02-28 NOTE — Progress Notes (Signed)
Patient ID: Krystal Kim, female   DOB: April 03, 1976, 46 y.o.   MRN: [id number]  HPI  Krystal Kim is a 46 y.o.-year-old female, initially referred by her gastroenterologist, Dr.Mann, returning for follow-up for Hashimoto's hypothyroidism.  Last visit 6 months ago.  Interim history: She has no complaints at today's visit other than inability to lose weight.  Reviewed history: Patient was seen by Dr. Collene Mares in 05/2021 for a screening colonoscopy.  At that time, she did mention occasional constipation.  During investigation for this, she was found to have a high TSH.  She was referred to endocrinology.  Patient mentions a h/o large weight fluctuations -she was able to lose weight in the past on a low calorie diet >> 60-90 lbs, however, she gained that back easily.  She has fatigue (insomnia): works a full time job and has 4 children.   She has a distant history of regular menstrual cycles and heavy bleeding.  She was on OCPs since a teenager.  She developed a DVT and PE (on OCPs) in 2017 >> could not exercise, relaxed diet >> gained weight.  Now on Pg IUD.  No depression. Some hair thinning. No cold intolerance.  After starting levothyroxine, she immediately lost 11 pounds.  We started levothyroxine 25 mcg daily in 05/2021.  We had to increase to 50 mcg daily afterwards.  She takes this: - in am - fasting - at least 30 min from b'fast - no calcium - no iron - no multivitamins - no PPIs - not on Biotin  I reviewed pt's thyroid tests: Lab Results  Component Value Date   TSH 4.71 10/19/2021   TSH 6.29 (H) 09/02/2021   TSH 4.27 07/13/2021   TSH 7.87 (H) 06/02/2021   FREET4 0.83 10/19/2021   FREET4 0.68 09/02/2021   FREET4 0.87 07/13/2021   FREET4 0.53 (L) 06/02/2021   T3FREE 3.4 06/02/2021  05/18/2021: TSH 10.1  Antithyroid antibodies: Component     Latest Ref Rng 06/02/2021  Thyroglobulin Ab     < or = 1 IU/mL 63 (H)   Thyroperoxidase Ab SerPl-aCnc     <9 IU/mL 27 (H)    Pt  denies feeling nodules in neck, hoarseness, dysphagia/odynophagia.  She has no FH of thyroid disorders.No FH of thyroid cancer.  No h/o radiation tx to head or neck. No recent use of iodine supplements.  ROS: Constitutional: + See HPI  Past Medical History:  Diagnosis Date   Anemia    Obesity    PE (pulmonary embolism) 10/2015   PONV (postoperative nausea and vomiting)    Seasonal allergies    Past Surgical History:  Procedure Laterality Date   CESAREAN SECTION     CESAREAN SECTION N/A 01/19/2014   Procedure: REPEAT CESAREAN SECTION;  Surgeon: Alwyn Pea, MD;  Location: [facility];  Service: Obstetrics;  Laterality: N/A;   VAGINA RECONSTRUCTION SURGERY     Social History   Socioeconomic History   Marital status: Married    Spouse name: Not on file   Number of children: 4   Years of education: Not on file   Highest education level: Not on file  Occupational History   Occupation: Mudlogger of finance  Tobacco Use   Smoking status: Never   Smokeless tobacco: Never  Substance and Sexual Activity   Alcohol use: Yes    Comment: Occasionally   Drug use: No   Sexual activity: Yes    Birth control/protection: None  Other Topics Concern   Not  on file  Social History Narrative   Lives with husband and 3 children.  Works in Audiological scientist at [employer].   Social Determinants of Health   Financial Resource Strain: Not on file  Food Insecurity: Not on file  Transportation Needs: Not on file  Physical Activity: Not on file  Stress: Not on file  Social Connections: Not on file  Intimate Partner Violence: Not on file   Current Outpatient Medications on File Prior to Visit  Medication Sig Dispense Refill   levonorgestrel (MIRENA, 52 MG,) 20 MCG/DAY IUD Mirena     levothyroxine (SYNTHROID) 50 MCG tablet Take 1 tablet by mouth daily before breakfast. 45 tablet 3   Sod Picosulfate-Mag Ox-Cit Acd (CLENPIQ) 10-3.5-12 MG-GM -GM/160ML SOLN Take 160 mL by mouth twice  as directed by  instructions from office. 320 mL 0   No current facility-administered medications on file prior to visit.   Allergies  Allergen Reactions   Codeine     REACTION: Nausea and vomiting   Family History  Problem Relation Age of Onset   Diabetes Father    Diabetes Maternal Aunt    Heart disease Maternal Grandmother    Hypertension Paternal Grandfather    Heart disease Paternal Grandfather    Hypertension Son    PE: BP 120/78 (BP Location: Right Arm, Patient Position: Sitting, Cuff Size: Normal)   Pulse 70   Ht 5\' 3"  (1.6 m)   Wt 250 lb 12.8 oz (113.8 kg)   SpO2 91%   BMI 44.43 kg/m  Wt Readings from Last 3 Encounters:  02/28/22 250 lb 12.8 oz (113.8 kg)  01/10/22 247 lb (112 kg)  09/02/21 246 lb 9.6 oz (111.9 kg)   Constitutional: overweight, in NAD Eyes:  EOMI, no exophthalmos ENT: no neck masses, no cervical lymphadenopathy Cardiovascular: RRR, No MRG Respiratory: CTA B Musculoskeletal: no deformities Skin:no rashes Neurological: no tremor with outstretched hands  ASSESSMENT: 1. Hypothyroidism due to Hashimoto's thyroiditis  2. Obesity class 3  PLAN:  1. Patient with history of elevated TSH during investigation for constipation by Dr. 09/04/21.   -At our first visit, she appeared euthyroid but did describe weight fluctuations and difficulty losing weight.  We rechecked a TSH and this was elevated while the free T4 was low.  Also, she had positive thyroglobulin and TPO antibodies, confirming Hashimoto's thyroiditis. -We ended up starting levothyroxine 25 mcg daily and then increase to 50 mcg daily at last visit 6 months ago. -She mentions that she did not feel different after starting levothyroxine but reviewing her weight on our scale, she lost 11 pounds. Now gained 4 lbs. - latest thyroid labs reviewed with pt. >> normal: Lab Results  Component Value Date   TSH 4.71 10/19/2021  - she continues on LT4 50 mcg daily - pt feels good on this dose. - we discussed about  taking the thyroid hormone every day, with water, >30 minutes before breakfast, separated by >4 hours from acid reflux medications, calcium, iron, multivitamins. Pt. is taking it correctly. - will check thyroid tests today: TSH and fT4 - If labs are abnormal, she will need to return for repeat TFTs in 1.5 months - Otherwise, I will see her back in 1 year, but with labs at 6 months  2. Obesity class 3 - she is not able to lose weight - she d/w PCP - advised that she is not prediabetic (HbA1c was 5.4%) or have any other metabolic pbs. However, reviewing her lipid panel, she did have an  elevated TG and LDL cholesterol - we discussed about [facility] - she tried this in the past - she will think about it and let me know if she desires to see them - I can put in a referral  Needs refills to [facility].  Component     Latest Ref Rng 02/28/2022  TSH     0.35 - 5.50 uIU/mL 7.04 (H)   T4,Free(Direct)     0.60 - 1.60 ng/dL 6.54    TSH is elevated so I will advise her to increase the levothyroxine dose to 75 mcg daily and recheck the test in 1.5 months.  Carlus Pavlov, MD PhD [facility] Endocrinology

## 2022-03-07 ENCOUNTER — Ambulatory Visit: Payer: 59 | Admitting: Internal Medicine

## 2022-04-06 ENCOUNTER — Other Ambulatory Visit (INDEPENDENT_AMBULATORY_CARE_PROVIDER_SITE_OTHER): Payer: 59

## 2022-04-06 DIAGNOSIS — E038 Other specified hypothyroidism: Secondary | ICD-10-CM | POA: Diagnosis not present

## 2022-04-06 DIAGNOSIS — E063 Autoimmune thyroiditis: Secondary | ICD-10-CM

## 2022-04-06 LAB — TSH: TSH: 2.13 u[IU]/mL (ref 0.35–5.50)

## 2022-04-06 LAB — T4, FREE: Free T4: 0.8 ng/dL (ref 0.60–1.60)

## 2022-04-10 ENCOUNTER — Other Ambulatory Visit (HOSPITAL_COMMUNITY): Payer: Self-pay

## 2022-04-10 DIAGNOSIS — Z01419 Encounter for gynecological examination (general) (routine) without abnormal findings: Secondary | ICD-10-CM | POA: Diagnosis not present

## 2022-04-10 DIAGNOSIS — Z6841 Body Mass Index (BMI) 40.0 and over, adult: Secondary | ICD-10-CM | POA: Diagnosis not present

## 2022-04-10 DIAGNOSIS — Z3040 Encounter for surveillance of contraceptives, unspecified: Secondary | ICD-10-CM | POA: Diagnosis not present

## 2022-04-10 DIAGNOSIS — Z1211 Encounter for screening for malignant neoplasm of colon: Secondary | ICD-10-CM | POA: Diagnosis not present

## 2022-04-10 DIAGNOSIS — Z1231 Encounter for screening mammogram for malignant neoplasm of breast: Secondary | ICD-10-CM | POA: Diagnosis not present

## 2022-05-15 DIAGNOSIS — H5203 Hypermetropia, bilateral: Secondary | ICD-10-CM | POA: Diagnosis not present

## 2022-05-23 ENCOUNTER — Other Ambulatory Visit (HOSPITAL_COMMUNITY): Payer: Self-pay

## 2022-05-24 ENCOUNTER — Other Ambulatory Visit (HOSPITAL_COMMUNITY): Payer: Self-pay

## 2022-05-24 ENCOUNTER — Encounter (HOSPITAL_COMMUNITY): Payer: Self-pay

## 2022-05-26 ENCOUNTER — Other Ambulatory Visit: Payer: Self-pay

## 2022-05-26 ENCOUNTER — Other Ambulatory Visit (HOSPITAL_COMMUNITY): Payer: Self-pay

## 2022-05-29 ENCOUNTER — Other Ambulatory Visit: Payer: Self-pay

## 2022-05-31 ENCOUNTER — Other Ambulatory Visit: Payer: Self-pay

## 2022-06-19 DIAGNOSIS — E039 Hypothyroidism, unspecified: Secondary | ICD-10-CM | POA: Diagnosis not present

## 2022-06-19 DIAGNOSIS — Z1211 Encounter for screening for malignant neoplasm of colon: Secondary | ICD-10-CM | POA: Diagnosis not present

## 2022-07-10 ENCOUNTER — Other Ambulatory Visit (HOSPITAL_COMMUNITY): Payer: Self-pay

## 2022-07-10 ENCOUNTER — Other Ambulatory Visit: Payer: Self-pay

## 2022-07-10 ENCOUNTER — Encounter (HOSPITAL_COMMUNITY): Payer: Self-pay

## 2022-08-29 ENCOUNTER — Other Ambulatory Visit: Payer: Self-pay

## 2022-10-13 ENCOUNTER — Other Ambulatory Visit (HOSPITAL_COMMUNITY): Payer: Self-pay

## 2022-11-20 ENCOUNTER — Other Ambulatory Visit: Payer: Self-pay

## 2022-11-20 ENCOUNTER — Other Ambulatory Visit: Payer: Self-pay | Admitting: Internal Medicine

## 2022-11-20 DIAGNOSIS — R7989 Other specified abnormal findings of blood chemistry: Secondary | ICD-10-CM

## 2022-11-20 MED ORDER — LEVOTHYROXINE SODIUM 75 MCG PO TABS
75.0000 ug | ORAL_TABLET | Freq: Every day | ORAL | 2 refills | Status: DC
  Filled 2022-11-20: qty 45, 45d supply, fill #0
  Filled 2023-01-14: qty 45, 45d supply, fill #1

## 2022-11-21 ENCOUNTER — Other Ambulatory Visit: Payer: Self-pay

## 2022-11-28 ENCOUNTER — Encounter: Payer: Self-pay | Admitting: Nurse Practitioner

## 2022-11-28 ENCOUNTER — Ambulatory Visit (INDEPENDENT_AMBULATORY_CARE_PROVIDER_SITE_OTHER): Payer: 59 | Admitting: Nurse Practitioner

## 2022-11-28 VITALS — BP 132/82 | HR 80 | Temp 98.3°F | Ht 63.0 in | Wt 249.0 lb

## 2022-11-28 DIAGNOSIS — E063 Autoimmune thyroiditis: Secondary | ICD-10-CM

## 2022-11-28 DIAGNOSIS — E038 Other specified hypothyroidism: Secondary | ICD-10-CM

## 2022-11-28 DIAGNOSIS — Z0289 Encounter for other administrative examinations: Secondary | ICD-10-CM

## 2022-11-28 DIAGNOSIS — Z6841 Body Mass Index (BMI) 40.0 and over, adult: Secondary | ICD-10-CM | POA: Diagnosis not present

## 2022-11-28 NOTE — Progress Notes (Signed)
Office: [phone]  /  Fax: [phone]   Initial Visit  Krystal Kim was seen in clinic today to evaluate for obesity. She is interested in losing weight to improve overall health and reduce the risk of weight related complications. She presents today to review program treatment options, initial physical assessment, and evaluation.     She was referred by: Specialist  When asked what else they would like to accomplish? She states: Improve existing medical conditions, Reduce number of medications, Improve quality of life, and Lose a target amount of weight : Goal weight:  <200 lbs  Weight history:  She started gaining weight as a child.  Her weight has fluctualated over the years.    When asked how has your weight affected you? She states: Having fatigue  Some associated conditions: PE, Hypothyroidism due to Hashimoto's thyroiditis, Plantar fasciitis   Contributing factors: Family history  Weight promoting medications identified: None  Current nutrition plan: None She has tried nutrasystem, LA weight loss, LCP  Current level of physical activity: None  Current or previous pharmacotherapy: None  Response to medication: Never tried medications   Past medical history includes:   Past Medical History:  Diagnosis Date   Anemia    Obesity    PE (pulmonary embolism) 10/2015   PONV (postoperative nausea and vomiting)    Seasonal allergies      Objective:   BP 132/82   Pulse 80   Temp 98.3 F (36.8 C)   Ht 5\' 3"  (1.6 m)   Wt 249 lb (112.9 kg)   LMP  (LMP Unknown)   SpO2 97%   BMI 44.11 kg/m  She was weighed on the bioimpedance scale: Body mass index is 44.11 kg/m.  Peak Weight:249 lbs , Body Fat%:48.5, Visceral Fat Rating:15, Weight trend over the last 12 months: Increasing  General:  Alert, oriented and cooperative. Patient is in no acute distress.  Respiratory: Normal respiratory effort, no problems with respiration noted   Gait: able to ambulate independently   Mental Status: Normal mood and affect. Normal behavior. Normal judgment and thought content.   DIAGNOSTIC DATA REVIEWED:  BMET    Component Value Date/Time   NA 139 01/20/2022 0856   K 5.1 01/20/2022 0856   CL 103 01/20/2022 0856   CO2 20 01/20/2022 0856   GLUCOSE 94 01/20/2022 0856   GLUCOSE 88 11/05/2015 0228   BUN 11 01/20/2022 0856   CREATININE 0.80 01/20/2022 0856   CALCIUM 9.6 01/20/2022 0856   GFRNONAA 112 07/24/2017 1138   GFRAA 130 07/24/2017 1138   Lab Results  Component Value Date   HGBA1C 5.4 01/20/2022   No results found for: "INSULIN" CBC    Component Value Date/Time   WBC 10.3 07/24/2017 1138   WBC 7.6 11/05/2015 0228   RBC 4.86 07/24/2017 1138   RBC 4.74 11/05/2015 0228   HGB 12.7 07/24/2017 1138   HCT 39.8 07/24/2017 1138   PLT 321 07/24/2017 1138   MCV 82 07/24/2017 1138   MCH 26.1 (L) 07/24/2017 1138   MCH 23.6 (L) 11/05/2015 0228   MCHC 31.9 07/24/2017 1138   MCHC 30.1 11/05/2015 0228   RDW 16.4 (H) 07/24/2017 1138   Iron/TIBC/Ferritin/ %Sat No results found for: "IRON", "TIBC", "FERRITIN", "IRONPCTSAT" Lipid Panel     Component Value Date/Time   CHOL 204 (H) 01/20/2022 0856   TRIG 161 (H) 01/20/2022 0856   HDL 49 01/20/2022 0856   CHOLHDL 4.2 01/20/2022 0856   LDLCALC 126 (H) 01/20/2022 2956  Hepatic Function Panel     Component Value Date/Time   PROT 7.3 07/24/2017 1138   ALBUMIN 4.3 07/24/2017 1138   AST 19 07/24/2017 1138   ALT 16 07/24/2017 1138   ALKPHOS 107 07/24/2017 1138   BILITOT 0.2 07/24/2017 1138      Component Value Date/Time   TSH 2.13 04/06/2022 0812     Assessment and Plan:   Hypothyroidism due to Hashimoto's thyroiditis Continue to follow up with Endo. Continue meds as directed  Morbid obesity (HCC)  BMI 40.0-44.9, adult (HCC)        Obesity Treatment / Action Plan:  Patient will work on garnering support from family and friends to begin weight loss journey. Will work on eliminating or  reducing the presence of highly palatable, calorie dense foods in the home. Will complete provided nutritional and psychosocial assessment questionnaire before the next appointment. Will be scheduled for indirect calorimetry to determine resting energy expenditure in a fasting state.  This will allow [place] to create a reduced calorie, high-protein meal plan to promote loss of fat mass while preserving muscle mass. Counseled on the health benefits of losing 5%-15% of total body weight. Was counseled on nutritional approaches to weight loss and benefits of reducing processed foods and consuming plant-based foods and high quality protein as part of nutritional weight management. Was counseled on pharmacotherapy and role as an adjunct in weight management.   Obesity Education Performed Today:  She was weighed on the bioimpedance scale and results were discussed and documented in the synopsis.  We discussed obesity as a disease and the importance of a more detailed evaluation of all the factors contributing to the disease.  We discussed the importance of long term lifestyle changes which include nutrition, exercise and behavioral modifications as well as the importance of customizing this to her specific health and social needs.  We discussed the benefits of reaching a healthier weight to alleviate the symptoms of existing conditions and reduce the risks of the biomechanical, metabolic and psychological effects of obesity.  [name] appears to be in the action stage of change and states they are ready to start intensive lifestyle modifications and behavioral modifications.  30 minutes was spent today on this visit including the above counseling, pre-visit chart review, and post-visit documentation.  Reviewed by clinician on day of visit: allergies, medications, problem list, medical history, surgical history, family history, social history, and previous encounter notes pertinent to obesity  diagnosis.    [name] [name] FNP-C

## 2022-12-12 ENCOUNTER — Encounter: Payer: Self-pay | Admitting: Bariatrics

## 2022-12-12 ENCOUNTER — Ambulatory Visit (INDEPENDENT_AMBULATORY_CARE_PROVIDER_SITE_OTHER): Payer: 59 | Admitting: Bariatrics

## 2022-12-12 VITALS — BP 115/81 | HR 70 | Temp 97.5°F | Ht 63.0 in | Wt 251.0 lb

## 2022-12-12 DIAGNOSIS — Z0000 Encounter for general adult medical examination without abnormal findings: Secondary | ICD-10-CM

## 2022-12-12 DIAGNOSIS — E038 Other specified hypothyroidism: Secondary | ICD-10-CM | POA: Diagnosis not present

## 2022-12-12 DIAGNOSIS — Z1331 Encounter for screening for depression: Secondary | ICD-10-CM | POA: Diagnosis not present

## 2022-12-12 DIAGNOSIS — R0602 Shortness of breath: Secondary | ICD-10-CM | POA: Insufficient documentation

## 2022-12-12 DIAGNOSIS — E559 Vitamin D deficiency, unspecified: Secondary | ICD-10-CM | POA: Diagnosis not present

## 2022-12-12 DIAGNOSIS — Z833 Family history of diabetes mellitus: Secondary | ICD-10-CM

## 2022-12-12 DIAGNOSIS — R5383 Other fatigue: Secondary | ICD-10-CM | POA: Diagnosis not present

## 2022-12-12 DIAGNOSIS — E7800 Pure hypercholesterolemia, unspecified: Secondary | ICD-10-CM | POA: Diagnosis not present

## 2022-12-12 DIAGNOSIS — E063 Autoimmune thyroiditis: Secondary | ICD-10-CM | POA: Diagnosis not present

## 2022-12-12 DIAGNOSIS — Z6841 Body Mass Index (BMI) 40.0 and over, adult: Secondary | ICD-10-CM | POA: Diagnosis not present

## 2022-12-13 ENCOUNTER — Encounter (INDEPENDENT_AMBULATORY_CARE_PROVIDER_SITE_OTHER): Payer: Self-pay | Admitting: Bariatrics

## 2022-12-13 ENCOUNTER — Encounter: Payer: Self-pay | Admitting: Bariatrics

## 2022-12-13 LAB — COMPREHENSIVE METABOLIC PANEL
ALT: 26 IU/L (ref 0–32)
AST: 23 IU/L (ref 0–40)
Albumin: 4.2 g/dL (ref 3.9–4.9)
Alkaline Phosphatase: 98 IU/L (ref 44–121)
BUN/Creatinine Ratio: 14 (ref 9–23)
BUN: 10 mg/dL (ref 6–24)
Bilirubin Total: 0.3 mg/dL (ref 0.0–1.2)
CO2: 19 mmol/L — ABNORMAL LOW (ref 20–29)
Calcium: 8.9 mg/dL (ref 8.7–10.2)
Chloride: 107 mmol/L — ABNORMAL HIGH (ref 96–106)
Creatinine, Ser: 0.69 mg/dL (ref 0.57–1.00)
Globulin, Total: 2.8 g/dL (ref 1.5–4.5)
Glucose: 79 mg/dL (ref 70–99)
Potassium: 4.4 mmol/L (ref 3.5–5.2)
Sodium: 141 mmol/L (ref 134–144)
Total Protein: 7 g/dL (ref 6.0–8.5)
eGFR: 108 mL/min/{1.73_m2} (ref >59)

## 2022-12-13 LAB — LIPID PANEL WITH LDL/HDL RATIO
Cholesterol, Total: 200 mg/dL — ABNORMAL HIGH (ref 100–199)
HDL: 40 mg/dL (ref >39)
LDL Chol Calc (NIH): 128 mg/dL — ABNORMAL HIGH (ref 0–99)
LDL/HDL Ratio: 3.2 ratio (ref 0.0–3.2)
Triglycerides: 179 mg/dL — ABNORMAL HIGH (ref 0–149)
VLDL Cholesterol Cal: 32 mg/dL (ref 5–40)

## 2022-12-13 LAB — CBC WITH DIFFERENTIAL/PLATELET
Basophils Absolute: 0 10*3/uL (ref 0.0–0.2)
Basos: 1 %
EOS (ABSOLUTE): 0.2 10*3/uL (ref 0.0–0.4)
Eos: 3 %
Hematocrit: 42.5 % (ref 34.0–46.6)
Hemoglobin: 13.9 g/dL (ref 11.1–15.9)
Immature Grans (Abs): 0.1 10*3/uL (ref 0.0–0.1)
Immature Granulocytes: 1 %
Lymphocytes Absolute: 2.1 10*3/uL (ref 0.7–3.1)
Lymphs: 36 %
MCH: 29.8 pg (ref 26.6–33.0)
MCHC: 32.7 g/dL (ref 31.5–35.7)
MCV: 91 fL (ref 79–97)
Monocytes Absolute: 0.5 10*3/uL (ref 0.1–0.9)
Monocytes: 9 %
Neutrophils Absolute: 2.9 10*3/uL (ref 1.4–7.0)
Neutrophils: 50 %
Platelets: 66 10*3/uL — CL (ref 150–450)
RBC: 4.66 x10E6/uL (ref 3.77–5.28)
RDW: 13.3 % (ref 11.7–15.4)
WBC: 5.8 10*3/uL (ref 3.4–10.8)

## 2022-12-13 LAB — TSH+T4F+T3FREE
Free T4: 1.07 ng/dL (ref 0.82–1.77)
T3, Free: 3 pg/mL (ref 2.0–4.4)
TSH: 5.82 u[IU]/mL — ABNORMAL HIGH (ref 0.450–4.500)

## 2022-12-13 LAB — VITAMIN D 25 HYDROXY (VIT D DEFICIENCY, FRACTURES): Vit D, 25-Hydroxy: 16.9 ng/mL — ABNORMAL LOW (ref 30.0–100.0)

## 2022-12-13 LAB — HEMOGLOBIN A1C
Est. average glucose Bld gHb Est-mCnc: 108 mg/dL
Hgb A1c MFr Bld: 5.4 % (ref 4.8–5.6)

## 2022-12-13 LAB — INSULIN, RANDOM: INSULIN: 18.8 u[IU]/mL (ref 2.6–24.9)

## 2022-12-13 NOTE — Progress Notes (Signed)
Chief Complaint:   Krystal Kim (MR# [id number]) is a 47 y.o. female who presents for evaluation and treatment of Krystal and related comorbidities. Current BMI is Body mass index is 44.46 kg/m. Krystal Kim has been struggling with her weight for many years and has been unsuccessful in either losing weight, maintaining weight loss, or reaching her healthy weight goal.  Krystal Kim is currently in the action stage of change and ready to dedicate time achieving and maintaining a healthier weight. Krystal Kim is interested in becoming our patient and working on intensive lifestyle modifications including (but not limited to) diet and exercise for weight loss.  Patient is here for her initial visit. She was seen by Judeth Cornfield, nurse practitioner on 11/28/2022. She states that she does not like to cook due to tome constraints.   Krystal Kim's habits were reviewed today and are as follows: Her family eats meals together, she struggles with family and or coworkers weight loss sabotage, her desired weight loss is 52 lbs, she has been heavy most of her life, she started gaining weight as she has always been up and down, her heaviest weight ever was 249 pounds, she skips meals frequently, she is frequently drinking liquids with calories, and she frequently makes poor food choices.  Depression Screen Krystal Kim's Food and Mood (modified PHQ-9) score was 5.  Subjective:   1. Other fatigue Krystal Kim admits to daytime somnolence and admits to waking up still tired. Patient has a history of symptoms of daytime fatigue and morning fatigue. Krystal Kim generally gets 6 or 7 hours of sleep per night, and states that she has generally restful sleep. Snoring is present. Apneic episodes are not present. Epworth Sleepiness Score is 7.   2. SOB (shortness of breath) on exertion Krystal Kim notes increasing shortness of breath with exercising and seems to be worsening over time with weight gain. She notes getting out of breath sooner with activity  than she used to. This has not gotten worse recently. Krystal Kim denies shortness of breath at rest or orthopnea.  3. Hypothyroidism due to Hashimoto's thyroiditis Patient is taking levothyroxine.  4. Vitamin D deficiency Patient is not on Vitamin supplementation.   5. Health care maintenance Given Krystal.   6. Family history of diabetes mellitus Patient is not on medications.   7. Elevated cholesterol Patient is not on medications. Her recent total cholesterol was 204 and LDL 126.  Assessment/Plan:   1. Other fatigue Krystal Kim does feel that her weight is causing her energy to be lower than it should be. Fatigue may be related to Krystal, depression or many other causes. Labs will be ordered, and in the meanwhile, Zaya will focus on self care including making healthy food choices, increasing physical activity and focusing on stress reduction.  - EKG 12-Lead - TSH+T4F+T3Free  2. SOB (shortness of breath) on exertion Krystal Kim does feel that she gets out of breath more easily that she used to when she exercises. Krystal Kim's shortness of breath appears to be Krystal related and exercise induced. She has agreed to work on weight loss and gradually increase exercise to treat her exercise induced shortness of breath. Will continue to monitor closely.  - TSH+T4F+T3Free  3. Hypothyroidism due to Hashimoto's thyroiditis We will check labs today, and we will follow-up at patient's next visit.   - TSH+T4F+T3Free  4. Vitamin D deficiency We will check labs today, and we will follow-up at patient's next visit.   - VITAMIN D 25 Hydroxy (Vit-D Deficiency, Fractures)  5.  Health care maintenance We will check labs today. EKG and IC were reviewed with the patient today.   - TSH+T4F+T3Free - VITAMIN D 25 Hydroxy (Vit-D Deficiency, Fractures) - Lipid Panel With LDL/HDL Ratio - Insulin, random - Hemoglobin A1c - Comprehensive metabolic panel - CBC with Differential/Platelet  6. Family history of  diabetes mellitus We will check labs today, and we will follow-up at patient's next visit.   - Insulin, random - Hemoglobin A1c  7. Elevated cholesterol We will check labs today, and we will follow-up at patient's next visit.   - Lipid Panel With LDL/HDL Ratio  8. Depression screening Krystal Kim had a positive depression screening. Depression is commonly associated with Krystal and often results in emotional eating behaviors. We will monitor this closely and work on CBT to help improve the non-hunger eating patterns. Referral to Psychology may be required if no improvement is seen as she continues in our clinic.  9. Morbid Krystal (HCC) - TSH+T4F+T3Free - VITAMIN D 25 Hydroxy (Vit-D Deficiency, Fractures) - Lipid Panel With LDL/HDL Ratio - Insulin, random - Hemoglobin A1c - Comprehensive metabolic panel - CBC with Differential/Platelet  10. BMI 40.0-44.9, adult (HCC) Krystal Kim is currently in the action stage of change and her goal is to continue with weight loss efforts. I recommend Krystal Kim begin the structured treatment plan as follows:  She has agreed to the Category 2 Plan.  Meal planning was discussed. Reviewed labs with the patient from 01/2022, CMP, lipid, A1c, and glucose. Minimize meal skipping.   Exercise goals: No exercise has been prescribed at this time.   Behavioral modification strategies: increasing lean protein intake, decreasing simple carbohydrates, increasing vegetables, increasing water intake, decreasing eating out, no skipping meals, meal planning and cooking strategies, keeping healthy foods in the home, and planning for success.  She was informed of the importance of frequent follow-up visits to maximize her success with intensive lifestyle modifications for her multiple health conditions. She was informed we would discuss her lab results at her next visit unless there is a critical issue that needs to be addressed sooner. Krystal Kim agreed to keep her next visit at the  agreed upon time to discuss these results.  Objective:   Blood pressure 115/81, pulse 70, temperature (!) 97.5 F (36.4 C), height 5\' 3"  (1.6 m), weight 251 lb (113.9 kg), SpO2 99 %. Body mass index is 44.46 kg/m.  EKG: Normal sinus rhythm, rate 68 BPM.  Indirect Calorimeter completed today shows a VO2 of 272 and a REE of 1872.  Her calculated basal metabolic rate is 1610 thus her basal metabolic rate is better than expected.  General: Cooperative, alert, well developed, in no acute distress. HEENT: Conjunctivae and lids unremarkable. Cardiovascular: Regular rhythm.  Lungs: Normal work of breathing. Neurologic: No focal deficits.   Lab Results  Component Value Date   CREATININE 0.80 01/20/2022   BUN 11 01/20/2022   NA 139 01/20/2022   K 5.1 01/20/2022   CL 103 01/20/2022   CO2 20 01/20/2022   Lab Results  Component Value Date   ALT 16 07/24/2017   AST 19 07/24/2017   ALKPHOS 107 07/24/2017   BILITOT 0.2 07/24/2017   Lab Results  Component Value Date   HGBA1C 5.4 01/20/2022   No results found for: "INSULIN" Lab Results  Component Value Date   TSH 2.13 04/06/2022   Lab Results  Component Value Date   CHOL 204 (H) 01/20/2022   HDL 49 01/20/2022   LDLCALC 126 (H) 01/20/2022  TRIG 161 (H) 01/20/2022   CHOLHDL 4.2 01/20/2022   Lab Results  Component Value Date   WBC 10.3 07/24/2017   HGB 12.7 07/24/2017   HCT 39.8 07/24/2017   MCV 82 07/24/2017   PLT 321 07/24/2017   No results found for: "IRON", "TIBC", "FERRITIN"  Attestation Statements:   Reviewed by clinician on day of visit: allergies, medications, problem list, medical history, surgical history, family history, social history, and previous encounter notes.   Trude Mcburney, am acting as Energy manager for [employer], DO.  I have reviewed the above documentation for accuracy and completeness, and I agree with the above. Corinna Capra, DO

## 2023-01-01 ENCOUNTER — Encounter: Payer: Self-pay | Admitting: Nurse Practitioner

## 2023-01-01 ENCOUNTER — Other Ambulatory Visit (HOSPITAL_COMMUNITY): Payer: Self-pay

## 2023-01-01 ENCOUNTER — Ambulatory Visit (INDEPENDENT_AMBULATORY_CARE_PROVIDER_SITE_OTHER): Payer: 59 | Admitting: Nurse Practitioner

## 2023-01-01 ENCOUNTER — Other Ambulatory Visit: Payer: Self-pay

## 2023-01-01 VITALS — BP 115/82 | HR 78 | Temp 98.1°F | Ht 63.0 in | Wt 245.0 lb

## 2023-01-01 DIAGNOSIS — D696 Thrombocytopenia, unspecified: Secondary | ICD-10-CM | POA: Diagnosis not present

## 2023-01-01 DIAGNOSIS — E038 Other specified hypothyroidism: Secondary | ICD-10-CM | POA: Diagnosis not present

## 2023-01-01 DIAGNOSIS — E063 Autoimmune thyroiditis: Secondary | ICD-10-CM

## 2023-01-01 DIAGNOSIS — E7849 Other hyperlipidemia: Secondary | ICD-10-CM | POA: Diagnosis not present

## 2023-01-01 DIAGNOSIS — E559 Vitamin D deficiency, unspecified: Secondary | ICD-10-CM | POA: Diagnosis not present

## 2023-01-01 DIAGNOSIS — Z6841 Body Mass Index (BMI) 40.0 and over, adult: Secondary | ICD-10-CM

## 2023-01-01 MED ORDER — VITAMIN D (ERGOCALCIFEROL) 1.25 MG (50000 UNIT) PO CAPS
50000.0000 [IU] | ORAL_CAPSULE | ORAL | 0 refills | Status: DC
Start: 2023-01-01 — End: 2023-02-26
  Filled 2023-01-01: qty 5, 35d supply, fill #0

## 2023-01-01 NOTE — Progress Notes (Signed)
Office: [phone]  /  Fax: [phone]  WEIGHT SUMMARY AND BIOMETRICS  Weight Lost Since Last Visit: 6lb  Weight Gained Since Last Visit: 0lb   Vitals Temp: 98.1 F (36.7 C) BP: 115/82 Pulse Rate: 78 SpO2: 98 %   Anthropometric Measurements Height: 5\' 3"  (1.6 m) Weight: 245 lb (111.1 kg) BMI (Calculated): 43.41 Weight at Last Visit: 251lb Weight Lost Since Last Visit: 6lb Weight Gained Since Last Visit: 0lb Starting Weight: 251lb Total Weight Loss (lbs): 6 lb (2.722 kg)   Body Composition  Body Fat %: 46.1 % Fat Mass (lbs): 113.2 lbs Muscle Mass (lbs): 125.6 lbs Total Body Water (lbs): 85.4 lbs Visceral Fat Rating : 14   Other Clinical Data Fasting: Yes Labs: No Today's Visit #: 2 Starting Date: 12/12/22     HPI  Chief Complaint: OBESITY  Azjah is here to discuss her progress with her obesity treatment plan. She is on the the Category 2 Plan and states she is following her eating plan approximately 100 % of the time. She states she is exercising 0 minutes 0 days per week.   Interval History:  Since last office visit she has lost 6 pounds.  She went to [place] since her last visit.  She is going to the beach this weekend.  She is drinking water, coffee and diet sodas.     Pharmacotherapy for weight loss: She is not currently taking medications  for medical weight loss.     Previous pharmacotherapy for medical weight loss: None  Bariatric surgery:  Patient has not had bariatric surgery.   Low platelets Denies bruising.  No history of low platelets.    Hyperlipidemia Medication(s): never been on meds.  FH:  unknown mother, father none  Lab Results  Component Value Date   CHOL 200 (H) 12/12/2022   HDL 40 12/12/2022   LDLCALC 128 (H) 12/12/2022   TRIG 179 (H) 12/12/2022   CHOLHDL 4.2 01/20/2022   Lab Results  Component Value Date   ALT 26 12/12/2022   AST 23 12/12/2022   ALKPHOS 98 12/12/2022   BILITOT 0.3 12/12/2022   The  10-year ASCVD risk score (Arnett DK, et al., 2019) is: 1.2%   Values used to calculate the score:     Age: 47 years     Sex: Female     Is Non-Hispanic African American: No     Diabetic: No     Tobacco smoker: No     Systolic Blood Pressure: 115 mmHg     Is BP treated: No     HDL Cholesterol: 40 mg/dL     Total Cholesterol: 200 mg/dL   Vit D deficiency  She is not taking Vit D  Lab Results  Component Value Date   VD25OH 16.9 (L) 12/12/2022     Hypothyroidism Stable.  Does not report symptoms associated with uncontrolled hypothyroidism.  Reports fatigue.   Medication(s): Levothyroxine 75 mcg daily. Has appt with endo on 03/01/23  Lab Results  Component Value Date   TSH 5.820 (H) 12/12/2022   PHYSICAL EXAM:  Blood pressure 115/82, pulse 78, temperature 98.1 F (36.7 C), height 5\' 3"  (1.6 m), weight 245 lb (111.1 kg), SpO2 98%. Body mass index is 43.4 kg/m.  General: She is overweight, cooperative, alert, well developed, and in no acute distress. PSYCH: Has normal mood, affect and thought process.   Extremities: No edema.  Neurologic: No gross sensory or motor deficits. No tremors or fasciculations noted.    DIAGNOSTIC  DATA REVIEWED:  BMET    Component Value Date/Time   NA 141 12/12/2022 0940   K 4.4 12/12/2022 0940   CL 107 (H) 12/12/2022 0940   CO2 19 (L) 12/12/2022 0940   GLUCOSE 79 12/12/2022 0940   GLUCOSE 88 11/05/2015 0228   BUN 10 12/12/2022 0940   CREATININE 0.69 12/12/2022 0940   CALCIUM 8.9 12/12/2022 0940   GFRNONAA 112 07/24/2017 1138   GFRAA 130 07/24/2017 1138   Lab Results  Component Value Date   HGBA1C 5.4 12/12/2022   HGBA1C 5.4 01/20/2022   Lab Results  Component Value Date   INSULIN 18.8 12/12/2022   Lab Results  Component Value Date   TSH 5.820 (H) 12/12/2022   CBC    Component Value Date/Time   WBC 5.8 12/12/2022 0940   WBC 7.6 11/05/2015 0228   RBC 4.66 12/12/2022 0940   RBC 4.74 11/05/2015 0228   HGB 13.9 12/12/2022  0940   HCT 42.5 12/12/2022 0940   PLT 66 (LL) 12/12/2022 0940   MCV 91 12/12/2022 0940   MCH 29.8 12/12/2022 0940   MCH 23.6 (L) 11/05/2015 0228   MCHC 32.7 12/12/2022 0940   MCHC 30.1 11/05/2015 0228   RDW 13.3 12/12/2022 0940   Iron Studies No results found for: "IRON", "TIBC", "FERRITIN", "IRONPCTSAT" Lipid Panel     Component Value Date/Time   CHOL 200 (H) 12/12/2022 0940   TRIG 179 (H) 12/12/2022 0940   HDL 40 12/12/2022 0940   CHOLHDL 4.2 01/20/2022 0856   LDLCALC 128 (H) 12/12/2022 0940   Hepatic Function Panel     Component Value Date/Time   PROT 7.0 12/12/2022 0940   ALBUMIN 4.2 12/12/2022 0940   AST 23 12/12/2022 0940   ALT 26 12/12/2022 0940   ALKPHOS 98 12/12/2022 0940   BILITOT 0.3 12/12/2022 0940      Component Value Date/Time   TSH 5.820 (H) 12/12/2022 0940   Nutritional Lab Results  Component Value Date   VD25OH 16.9 (L) 12/12/2022     ASSESSMENT AND PLAN  TREATMENT PLAN FOR OBESITY:  Recommended Dietary Goals  Fable is currently in the action stage of change. As such, her goal is to continue weight management plan. She has agreed to the Category 2 Plan.  Behavioral Intervention  We discussed the following Behavioral Modification Strategies today: increasing lean protein intake, decreasing simple carbohydrates , increasing vegetables, increasing lower glycemic fruits, increasing water intake, work on meal planning and preparation, continue to practice mindfulness when eating, and planning for success.  Additional resources provided today: NA  Recommended Physical Activity Goals  Nataya has been advised to work up to 150 minutes of moderate intensity aerobic activity a week and strengthening exercises 2-3 times per week for cardiovascular health, weight loss maintenance and preservation of muscle mass.   She has agreed to Think about ways to increase daily physical activity and overcoming barriers to exercise and Increase physical activity in  their day and reduce sedentary time (increase NEAT).  ASSOCIATED CONDITIONS ADDRESSED TODAY  Action/Plan  Low platelet count (HCC) -     CBC with Differential/Platelet  Other hyperlipidemia Cardiovascular risk and specific lipid/LDL goals reviewed.  We discussed several lifestyle modifications today and Karisma will continue to work on diet, exercise and weight loss efforts. Orders and follow up as documented in patient record.   Counseling Intensive lifestyle modifications are the first line treatment for this issue. Dietary changes: Increase soluble fiber. Decrease simple carbohydrates. Exercise changes: Moderate to vigorous-intensity  aerobic activity 150 minutes per week if tolerated. Lipid-lowering medications: see documented in medical record.   Vitamin D deficiency -     Vitamin D (Ergocalciferol); Take 1 capsule (50,000 Units total) by mouth every 7 (seven) days.  Dispense: 5 capsule; Refill: 0  Low Vitamin D level contributes to fatigue and are associated with obesity, breast, and colon cancer. She agrees to continue to take prescription Vitamin D @50 ,000 IU every week and will follow-up for routine testing of Vitamin D, at least 2-3 times per year to avoid over-replacement.   Hypothyroidism due to Hashimoto's thyroiditis Keep follow up appt with Endo.    Morbid obesity (HCC)  BMI 40.0-44.9, adult (HCC)  Labs reviewed in chart with patient from 12/12/22   Return in about 2 weeks (around 01/15/2023).Marland Kitchen She was informed of the importance of frequent follow up visits to maximize her success with intensive lifestyle modifications for her multiple health conditions.   ATTESTASTION STATEMENTS:  Reviewed by clinician on day of visit: allergies, medications, problem list, medical history, surgical history, family history, social history, and previous encounter notes.     [name]. Shamiyah Ngu FNP-C

## 2023-01-02 ENCOUNTER — Encounter: Payer: Self-pay | Admitting: Nurse Practitioner

## 2023-01-02 LAB — CBC WITH DIFFERENTIAL/PLATELET
Basophils Absolute: 0 10*3/uL (ref 0.0–0.2)
Basos: 0 %
EOS (ABSOLUTE): 0.2 10*3/uL (ref 0.0–0.4)
Eos: 2 %
Hematocrit: 43.7 % (ref 34.0–46.6)
Hemoglobin: 14.6 g/dL (ref 11.1–15.9)
Immature Grans (Abs): 0.1 10*3/uL (ref 0.0–0.1)
Immature Granulocytes: 1 %
Lymphocytes Absolute: 2.4 10*3/uL (ref 0.7–3.1)
Lymphs: 27 %
MCH: 30.5 pg (ref 26.6–33.0)
MCHC: 33.4 g/dL (ref 31.5–35.7)
MCV: 91 fL (ref 79–97)
Monocytes Absolute: 0.9 10*3/uL (ref 0.1–0.9)
Monocytes: 10 %
Neutrophils Absolute: 5.4 10*3/uL (ref 1.4–7.0)
Neutrophils: 60 %
Platelets: 267 10*3/uL (ref 150–450)
RBC: 4.79 x10E6/uL (ref 3.77–5.28)
RDW: 12.8 % (ref 11.7–15.4)
WBC: 9 10*3/uL (ref 3.4–10.8)

## 2023-01-15 ENCOUNTER — Other Ambulatory Visit: Payer: Self-pay

## 2023-01-25 ENCOUNTER — Encounter: Payer: Self-pay | Admitting: Bariatrics

## 2023-01-25 ENCOUNTER — Ambulatory Visit (INDEPENDENT_AMBULATORY_CARE_PROVIDER_SITE_OTHER): Payer: 59 | Admitting: Bariatrics

## 2023-01-25 VITALS — BP 135/82 | HR 59 | Temp 98.1°F | Ht 63.0 in | Wt 242.0 lb

## 2023-01-25 DIAGNOSIS — E039 Hypothyroidism, unspecified: Secondary | ICD-10-CM

## 2023-01-25 DIAGNOSIS — E038 Other specified hypothyroidism: Secondary | ICD-10-CM

## 2023-01-25 DIAGNOSIS — Z6841 Body Mass Index (BMI) 40.0 and over, adult: Secondary | ICD-10-CM

## 2023-01-25 NOTE — Progress Notes (Signed)
   WEIGHT SUMMARY AND BIOMETRICS  Weight Lost Since Last Visit: 3lb  Weight Gained Since Last Visit: 0lb   Vitals Temp: 98.1 F (36.7 C) BP: 135/82 Pulse Rate: (!) 59 SpO2: 98 %   Anthropometric Measurements Height: 5\' 3"  (1.6 m) Weight: 242 lb (109.8 kg) BMI (Calculated): 42.88 Weight at Last Visit: 245lb Weight Lost Since Last Visit: 3lb Weight Gained Since Last Visit: 0lb Starting Weight: 251lb Total Weight Loss (lbs): 9 lb (4.082 kg)   Body Composition  Body Fat %: 44.6 % Fat Mass (lbs): 108.2 lbs Muscle Mass (lbs): 127.8 lbs Total Body Water (lbs): 87.8 lbs Visceral Fat Rating : 14   Other Clinical Data Fasting: No Labs: No Today's Visit #: 3 Starting Date: 12/12/22    OBESITY Krystal Kim is here to discuss her progress with her obesity treatment plan along with follow-up of her obesity related diagnoses.     Nutrition Plan: the Category 2 plan - 80% adherence.  Current exercise: none  Interim History:  She is down another 3 lbs since her last visit.  Eating all of the food on the plan., Protein intake is as prescribed, Is not skipping meals, Water intake is adequate., and Reports polyphagia   Hunger is moderately controlled.  Cravings are moderately controlled.  Assessment/Plan:   Hypothyroidism Stable.  Does not report symptoms associated with uncontrolled hypothyroidism. Medication(s): Levothyroxine 75 mcg daily. Lab Results  Component Value Date   TSH 5.820 (H) 12/12/2022    Plan: Continue levothyroxine at current dose. Counseling: The correct way to take levothyroxine is fasting, with water, separated by at least 30 minutes from breakfast, and separated by more than 4 hours from calcium, iron, multivitamins, acid reflux medications (PPIs).     Morbid Obesity: Current BMI BMI (Calculated): 42.88    Rajean is currently in the action stage of change. As such, her goal is to continue with weight loss efforts.  She has agreed to the  Category 2 plan.  Exercise goals: For substantial health benefits, adults should do at least 150 minutes (2 hours and 30 minutes) a week of moderate-intensity, or 75 minutes (1 hour and 15 minutes) a week of vigorous-intensity aerobic physical activity, or an equivalent combination of moderate- and vigorous-intensity aerobic activity. Aerobic activity should be performed in episodes of at least 10 minutes, and preferably, it should be spread throughout the week.  Behavioral modification strategies: increasing lean protein intake, no meal skipping, meal planning , better snacking choices, planning for success, increasing vegetables, avoiding temptations, and mindful eating.  Lynsee has agreed to follow-up with our clinic in 2 weeks.      Objective:   VITALS: Per patient if applicable, see vitals. GENERAL: Alert and in no acute distress. CARDIOPULMONARY: No increased WOB. Speaking in clear sentences.  PSYCH: Pleasant and cooperative. Speech normal rate and rhythm. Affect is appropriate. Insight and judgement are appropriate. Attention is focused, linear, and appropriate.  NEURO: Oriented as arrived to appointment on time with no prompting.   Attestation Statements:    This was prepared with the assistance of Engineer, civil (consulting).  Occasional wrong-word or sound-a-like substitutions may have occurred due to the inherent limitations of voice recognition software.   Corinna Capra, DO

## 2023-02-14 ENCOUNTER — Encounter: Payer: Self-pay | Admitting: Dermatology

## 2023-02-14 ENCOUNTER — Other Ambulatory Visit: Payer: Self-pay | Admitting: Nurse Practitioner

## 2023-02-14 ENCOUNTER — Ambulatory Visit: Payer: 59 | Admitting: Dermatology

## 2023-02-14 VITALS — BP 119/75 | HR 73

## 2023-02-14 DIAGNOSIS — Z1283 Encounter for screening for malignant neoplasm of skin: Secondary | ICD-10-CM

## 2023-02-14 DIAGNOSIS — L570 Actinic keratosis: Secondary | ICD-10-CM | POA: Diagnosis not present

## 2023-02-14 DIAGNOSIS — L821 Other seborrheic keratosis: Secondary | ICD-10-CM

## 2023-02-14 DIAGNOSIS — D229 Melanocytic nevi, unspecified: Secondary | ICD-10-CM

## 2023-02-14 DIAGNOSIS — D1801 Hemangioma of skin and subcutaneous tissue: Secondary | ICD-10-CM

## 2023-02-14 DIAGNOSIS — L814 Other melanin hyperpigmentation: Secondary | ICD-10-CM

## 2023-02-14 DIAGNOSIS — E559 Vitamin D deficiency, unspecified: Secondary | ICD-10-CM

## 2023-02-14 NOTE — Patient Instructions (Addendum)
Dear Ms. Krystal Kim,  Thank you for visiting my office today. Your dedication to your skin health is greatly appreciated. Below is a summary of the essential instructions and points from our consultation today:  - Skin Examination: A comprehensive head-to-toe skin check was conducted. Please continue to monitor your skin for any changes in moles or new concerns and report them.  - Sun Protection: It is crucial to continue applying sunscreen under your makeup when outdoors, especially in sunny climates like [place]. Remember to reapply every three hours.  - Treatment Recommendations For Actinic Keratosis on the Forearms and Chest:   - Topical Chemo Cream: This is scheduled for use in February to address precancerous spots on your arms. Please remember to avoid sun exposure during this treatment and be prepared for temporary skin irritation.   - Liquid Nitrogen Treatment: A larger spot on your right forearm was treated today to prevent its development into squamous cell carcinoma.  - Follow-Up Appointment: We are scheduled to see you again in February to evaluate the progress of the topical treatment and to continue with your skin monitoring.  - Skin Care Products: To support your sun protection routine, a goodie bag of travel-sized sunscreens was provided.  Please ensure to adhere to the care instructions provided. Should you have any questions or concerns before your next appointment, do not hesitate to reach out to our office.  Warm regards,  Dr. Langston Reusing, MD Dermatology       Cryotherapy Aftercare  Wash gently with soap and water everyday.   Apply Vaseline and Band-Aid daily until healed.    Important Information  Due to recent changes in healthcare laws, you may see results of your pathology and/or laboratory studies on MyChart before the doctors have had a chance to review them. We understand that in some cases there may be results that are confusing or concerning to you.  Please understand that not all results are received at the same time and often the doctors may need to interpret multiple results in order to provide you with the best plan of care or course of treatment. Therefore, we ask that you please give [place] 2 business days to thoroughly review all your results before contacting the office for clarification. Should we see a critical lab result, you will be contacted sooner.   If You Need Anything After Your Visit  If you have any questions or concerns for your doctor, please call our main line at [phone] If no one answers, please leave a voicemail as directed and we will return your call as soon as possible. Messages left after 4 pm will be answered the following business day.   You may also send [place] a message via MyChart. We typically respond to MyChart messages within 1-2 business days.  For prescription refills, please ask your pharmacy to contact our office. Our fax number is [phone].  If you have an urgent issue when the clinic is closed that cannot wait until the next business day, you can page your doctor at the number below.    Please note that while we do our best to be available for urgent issues outside of office hours, we are not available 24/7.   If you have an urgent issue and are unable to reach [place], you may choose to seek medical care at your doctor's office, retail clinic, urgent care center, or emergency room.  If you have a medical emergency, please immediately call 911 or go to the emergency department.  In the event of inclement weather, please call our main line at [phone] for an update on the status of any delays or closures.  Dermatology Medication Tips: Please keep the boxes that topical medications come in in order to help keep track of the instructions about where and how to use these. Pharmacies typically print the medication instructions only on the boxes and not directly on the medication tubes.   If your  medication is too expensive, please contact our office at [phone] or send [place] a message through MyChart.   We are unable to tell what your co-pay for medications will be in advance as this is different depending on your insurance coverage. However, we may be able to find a substitute medication at lower cost or fill out paperwork to get insurance to cover a needed medication.   If a prior authorization is required to get your medication covered by your insurance company, please allow [place] 1-2 business days to complete this process.  Drug prices often vary depending on where the prescription is filled and some pharmacies may offer cheaper prices.  The website www.goodrx.com contains coupons for medications through different pharmacies. The prices here do not account for what the cost may be with help from insurance (it may be cheaper with your insurance), but the website can give you the price if you did not use any insurance.  - You can print the associated coupon and take it with your prescription to the pharmacy.  - You may also stop by our office during regular business hours and pick up a GoodRx coupon card.  - If you need your prescription sent electronically to a different pharmacy, notify our office through [facility] or by phone at [phone]

## 2023-02-14 NOTE — Progress Notes (Unsigned)
   New Patient Visit   Subjective  Krystal Kim is a 47 y.o. female who presents for the following: Skin Cancer Screening and Full Body Skin Exam  The patient presents for Total-Body Skin Exam (TBSE) for skin cancer screening and mole check. The patient has spots, moles and lesions to be evaluated, some may be new or changing and the patient may have concern these could be cancer.   Pt has no hx of skin cancer but had a mole removed when she was young that was atypical. She does have a family hx of skin cancer. It has been several years since her last skin check.  The following portions of the chart were reviewed this encounter and updated as appropriate: medications, allergies, medical history  Review of Systems:  No other skin or systemic complaints except as noted in HPI or Assessment and Plan.  Objective  Well appearing patient in no apparent distress; mood and affect are within normal limits.  A full examination was performed including scalp, head, eyes, ears, nose, lips, neck, chest, axillae, abdomen, back, buttocks, bilateral upper extremities, bilateral lower extremities, hands, feet, fingers, toes, fingernails, and toenails. All findings within normal limits unless otherwise noted below.   Relevant physical exam findings are noted in the Assessment and Plan.  Exam: flesh colored papule on left jawline  Assessment & Plan   SKIN CANCER SCREENING PERFORMED TODAY.  ACTINIC DAMAGE on shoulders - Chronic condition, secondary to cumulative UV/sun exposure - diffuse scaly erythematous macules with underlying dyspigmentation - Recommend daily broad spectrum sunscreen SPF 30+ to sun-exposed areas, reapply every 2 hours as needed.  - Staying in the shade or wearing long sleeves, sun glasses (UVA+UVB protection) and wide brim hats (4-inch brim around the entire circumference of the hat) are also recommended for sun protection.  - Call for new or changing lesions.  Will start pt on 5FU  when she returns in February for actinic damage on her arms  MELANOCYTIC NEVI - Tan-brown and/or pink-flesh-colored symmetric macules and papules - Benign appearing on exam today - Observation - Call clinic for new or changing moles - Recommend daily use of broad spectrum spf 30+ sunscreen to sun-exposed areas.   SEBORRHEIC KERATOSIS - Stuck-on, waxy, tan-brown papules and/or plaques  - Benign-appearing - Discussed benign etiology and prognosis. - Observe - Call for any changes    LENTIGINES Exam: scattered tan macules Due to sun exposure Treatment Plan: Benign-appearing, observe. Recommend daily broad spectrum sunscreen SPF 30+ to sun-exposed areas, reapply every 2 hours as needed.  Call for any changes   HEMANGIOMA Exam: red papule(s) Discussed benign nature. Recommend observation. Call for changes.   NEVUS Benign, will observe    No follow-ups on file.  Owens Shark, CMA, am acting as scribe for [employer], DO.   Documentation: I have reviewed the above documentation for accuracy and completeness, and I agree with the above.  Langston Reusing, DO

## 2023-02-16 ENCOUNTER — Other Ambulatory Visit (HOSPITAL_COMMUNITY): Payer: Self-pay

## 2023-02-16 DIAGNOSIS — N764 Abscess of vulva: Secondary | ICD-10-CM | POA: Diagnosis not present

## 2023-02-16 MED ORDER — FLUCONAZOLE 150 MG PO TABS
150.0000 mg | ORAL_TABLET | ORAL | 1 refills | Status: DC
  Filled 2023-02-16: qty 1, 1d supply, fill #0

## 2023-02-16 MED ORDER — CEPHALEXIN 500 MG PO CAPS
500.0000 mg | ORAL_CAPSULE | Freq: Two times a day (BID) | ORAL | 0 refills | Status: DC
  Filled 2023-02-16: qty 14, 7d supply, fill #0

## 2023-02-26 ENCOUNTER — Other Ambulatory Visit (HOSPITAL_COMMUNITY): Payer: Self-pay

## 2023-02-26 ENCOUNTER — Telehealth: Payer: Self-pay

## 2023-02-26 ENCOUNTER — Other Ambulatory Visit: Payer: Self-pay

## 2023-02-26 ENCOUNTER — Encounter: Payer: Self-pay | Admitting: Nurse Practitioner

## 2023-02-26 ENCOUNTER — Ambulatory Visit (INDEPENDENT_AMBULATORY_CARE_PROVIDER_SITE_OTHER): Payer: 59 | Admitting: Nurse Practitioner

## 2023-02-26 VITALS — BP 115/79 | HR 76 | Temp 98.5°F | Ht 63.0 in | Wt 239.0 lb

## 2023-02-26 DIAGNOSIS — R632 Polyphagia: Secondary | ICD-10-CM

## 2023-02-26 DIAGNOSIS — Z6841 Body Mass Index (BMI) 40.0 and over, adult: Secondary | ICD-10-CM

## 2023-02-26 DIAGNOSIS — E559 Vitamin D deficiency, unspecified: Secondary | ICD-10-CM

## 2023-02-26 MED ORDER — WEGOVY 0.25 MG/0.5ML ~~LOC~~ SOAJ
0.2500 mg | SUBCUTANEOUS | 0 refills | Status: DC
Start: 2023-02-26 — End: 2023-04-25
  Filled 2023-02-26 – 2023-03-14 (×3): qty 2, 28d supply, fill #0

## 2023-02-26 MED ORDER — VITAMIN D (ERGOCALCIFEROL) 1.25 MG (50000 UNIT) PO CAPS
50000.0000 [IU] | ORAL_CAPSULE | ORAL | 0 refills | Status: DC
Start: 2023-02-26 — End: 2023-03-27
  Filled 2023-02-26: qty 5, 35d supply, fill #0

## 2023-02-26 NOTE — Telephone Encounter (Signed)
PA submitted through Cover My Meds for [facility]. Awaiting insurance determination. Key: [id number]

## 2023-02-26 NOTE — Progress Notes (Signed)
Office: [phone]  /  Fax: [phone]  WEIGHT SUMMARY AND BIOMETRICS  Weight Lost Since Last Visit: 3lb  Weight Gained Since Last Visit: 0lb   Vitals Temp: 98.5 F (36.9 C) BP: 115/79 Pulse Rate: 76 SpO2: 99 %   Anthropometric Measurements Height: 5\' 3"  (1.6 m) Weight: 239 lb (108.4 kg) BMI (Calculated): 42.35 Weight at Last Visit: 242lb Weight Lost Since Last Visit: 3lb Weight Gained Since Last Visit: 0lb Starting Weight: 251lb Total Weight Loss (lbs): 12 lb (5.443 kg)   Body Composition  Body Fat %: 46.1 % Fat Mass (lbs): 110.2 lbs Muscle Mass (lbs): 122.4 lbs Total Body Water (lbs): 83 lbs Visceral Fat Rating : 14   Other Clinical Data Fasting: No Labs: No Today's Visit #: 4 Starting Date: 12/12/22     HPI  Chief Complaint: OBESITY  Krystal Kim is here to discuss her progress with her obesity treatment plan. She is on the the Category 2 Plan and states she is following her eating plan approximately 80-85 % of the time. She states she is exercising 30 minutes 2 days per week.   Interval History:  Since last office visit she has lost 3 pounds.  She does well with breakfast and dinner.  Asking for lunch ideas.  She is struggling with polyphagia and cravings.  She is struggling with adding another day of exercise due to her work schedule.     Pharmacotherapy for weight loss: She is not currently taking medications  for medical weight loss.   Previous pharmacotherapy for medical weight loss:  none  Bariatric surgery:  has not had bariatric surgery  Vit D deficiency  She is taking Vit D 50,000 IU weekly.  Denies side effects.  Denies nausea, vomiting or muscle weakness.    Lab Results  Component Value Date   VD25OH 16.9 (L) 12/12/2022     PHYSICAL EXAM:  Blood pressure 115/79, pulse 76, temperature 98.5 F (36.9 C), height 5\' 3"  (1.6 m), weight 239 lb (108.4 kg), SpO2 99%. Body mass index is 42.34 kg/m.  General: She is overweight,  cooperative, alert, well developed, and in no acute distress. PSYCH: Has normal mood, affect and thought process.   Extremities: No edema.  Neurologic: No gross sensory or motor deficits. No tremors or fasciculations noted.    DIAGNOSTIC DATA REVIEWED:  BMET    Component Value Date/Time   NA 141 12/12/2022 0940   K 4.4 12/12/2022 0940   CL 107 (H) 12/12/2022 0940   CO2 19 (L) 12/12/2022 0940   GLUCOSE 79 12/12/2022 0940   GLUCOSE 88 11/05/2015 0228   BUN 10 12/12/2022 0940   CREATININE 0.69 12/12/2022 0940   CALCIUM 8.9 12/12/2022 0940   GFRNONAA 112 07/24/2017 1138   GFRAA 130 07/24/2017 1138   Lab Results  Component Value Date   HGBA1C 5.4 12/12/2022   HGBA1C 5.4 01/20/2022   Lab Results  Component Value Date   INSULIN 18.8 12/12/2022   Lab Results  Component Value Date   TSH 5.820 (H) 12/12/2022   CBC    Component Value Date/Time   WBC 9.0 01/01/2023 0755   WBC 7.6 11/05/2015 0228   RBC 4.79 01/01/2023 0755   RBC 4.74 11/05/2015 0228   HGB 14.6 01/01/2023 0755   HCT 43.7 01/01/2023 0755   PLT 267 01/01/2023 0755   MCV 91 01/01/2023 0755   MCH 30.5 01/01/2023 0755   MCH 23.6 (L) 11/05/2015 0228   MCHC 33.4 01/01/2023 0755   MCHC 30.1  11/05/2015 0228   RDW 12.8 01/01/2023 0755   Iron Studies No results found for: "IRON", "TIBC", "FERRITIN", "IRONPCTSAT" Lipid Panel     Component Value Date/Time   CHOL 200 (H) 12/12/2022 0940   TRIG 179 (H) 12/12/2022 0940   HDL 40 12/12/2022 0940   CHOLHDL 4.2 01/20/2022 0856   LDLCALC 128 (H) 12/12/2022 0940   Hepatic Function Panel     Component Value Date/Time   PROT 7.0 12/12/2022 0940   ALBUMIN 4.2 12/12/2022 0940   AST 23 12/12/2022 0940   ALT 26 12/12/2022 0940   ALKPHOS 98 12/12/2022 0940   BILITOT 0.3 12/12/2022 0940      Component Value Date/Time   TSH 5.820 (H) 12/12/2022 0940   Nutritional Lab Results  Component Value Date   VD25OH 16.9 (L) 12/12/2022     ASSESSMENT AND PLAN  TREATMENT  PLAN FOR OBESITY:  Recommended Dietary Goals  Sherell is currently in the action stage of change. As such, her goal is to continue weight management plan. She has agreed to the Category 2 Plan.  Behavioral Intervention  We discussed the following Behavioral Modification Strategies today: increasing lean protein intake, decreasing simple carbohydrates , increasing vegetables, increasing lower glycemic fruits, increasing water intake, work on meal planning and preparation, reading food labels , keeping healthy foods at home, continue to practice mindfulness when eating, and planning for success.  Additional resources provided today: NA  Recommended Physical Activity Goals  Analissa has been advised to work up to 150 minutes of moderate intensity aerobic activity a week and strengthening exercises 2-3 times per week for cardiovascular health, weight loss maintenance and preservation of muscle mass.   She has agreed to Continue current level of physical activity , Think about ways to increase daily physical activity and overcoming barriers to exercise, and Increase physical activity in their day and reduce sedentary time (increase NEAT).   Pharmacotherapy We discussed various medication options to help Evy with her weight loss efforts and we both agreed to start [facility] 0.25mg .  Side effects discussed.  Contraindications: Pancreatitis (active gallstones) Medullary thyroid cancer High triglycerides (>500)-will need labs prior to starting Multiple Endocrine Neoplasia syndrome type 2 (MEN 2) Trying to get pregnant Breastfeeding Use with caution with taking insulin or sulfonylureas (will need to monitor blood sugars for hypoglycemia)  ASSOCIATED CONDITIONS ADDRESSED TODAY  Action/Plan  Vitamin D deficiency -     Vitamin D (Ergocalciferol); Take 1 capsule (50,000 Units total) by mouth every 7 (seven) days.  Dispense: 5 capsule; Refill: 0. Side effects discussed  Low Vitamin D level  contributes to fatigue and are associated with obesity, breast, and colon cancer. She agrees to continue to take prescription Vitamin D @50 ,000 IU every week and will follow-up for routine testing of Vitamin D, at least 2-3 times per year to avoid over-replacement.   Polyphagia -     [id number]; Inject 0.25 mg into the skin once a week.  Dispense: 2 mL; Refill: 0  Intensive lifestyle modifications are the first line treatment for this issue. We discussed several lifestyle modifications today and she will continue to work on diet, exercise and weight loss efforts. Orders and follow up as documented in patient record.  Counseling Polyphagia is excessive hunger. Causes can include: low blood sugars, hypERthyroidism, PMS, lack of sleep, stress, insulin resistance, diabetes, certain medications, and diets that are deficient in protein and fiber.    Morbid obesity (HCC) -     [id number]; Inject 0.25 mg into the  skin once a week.  Dispense: 2 mL; Refill: 0  BMI 40.0-44.9, adult (HCC) -     YQMVHQ; Inject 0.25 mg into the skin once a week.  Dispense: 2 mL; Refill: 0      Has appt with endo this week   Return in about 4 weeks (around 03/26/2023).Marland Kitchen She was informed of the importance of frequent follow up visits to maximize her success with intensive lifestyle modifications for her multiple health conditions.   ATTESTASTION STATEMENTS:  Reviewed by clinician on day of visit: allergies, medications, problem list, medical history, surgical history, family history, social history, and previous encounter notes.      [name]. Dorrene Bently FNP-C

## 2023-02-26 NOTE — Patient Instructions (Signed)
Steps to starting your [facility]  The office staff will send a prior authorization request to your insurance company for approval. We will send you a mychart message once we hear back from your insurance with a decision.  This can take up to 7-10 business days.   Once your WegovyTis approved, you may then pick up [facility] pen from your pharmacy.    Learn how to do Wegovy injections on the [email or web address] website. There is a training video that will walk you through how to safely perform the injection. If you have questions for our clinical staff, please contact our  clinical staff. If you have any symptoms of allergic reaction to [facility] discontinue immediately and call 911.  1. What should I tell my provider before using WegovyT ? have or have had problems with your pancreas or kidneys. have type 2 diabetes and a history of diabetic retinopathy. have or have had depression, suicidal thoughts, or mental health issues. are pregnant or plan to become pregnant. Krystal Kim may harm your unborn baby. You should stop using WegovyT 3 months before you plan to become pregnant or if you are breastfeeding or plan to breastfeed. It is not known if WegovyT passes into your breast milk.  2. What is Krystal Kim and how does it work?  Krystal Kim is an injectable prescription medication prescribed by your provider to help with your weight loss.  This medicine will be most effective when combined with a reduced calorie diet and physical activity.  Krystal Kim is not for the treatment of type 2 diabetes mellitus. Krystal Kim should not be used with other GLP-1 receptor agonist medicines. The addition of WegovyT in  patients treated with insulin has not been evaluated. When initiating WegovyT, consider reducing the dose of concomitantly administered insulin secretagogues (such as sulfonylureas) or insulin to reduce the risk of  hypoglycemia.  One role of GLP-1 is to send a signal to your brain to tell it you are full. It also slows down  stomach emptying which will make you feel full longer and may help with reducing cravings.   3.  How should I take WegovyT?  Administer WegovyT once weekly, on the same day each week, at any time of day, with or without meals Inject subcutaneously in the abdomen, thigh or upper arm Initiate at 0.25 mg once weekly for 4 weeks. In 4 week intervals, increase the dose until a dose of 2.4 mg is reached (we will discuss with you the dosage at each visit). The maintenance dose of WegovyT is 2.4 mg once weekly.  The dosing schedule of Wegovy is:  0.25 mg per week X 4 weeks 0.5 mg per week X 4 weeks 1.0 mg per week X 4 weeks 1.7 mg per week X 4 weeks 2.4 mg per week   Missed dose   If you miss your injection day, go ahead inject your current dose. You can go >7 days, but not <7 days between injections. You may change your injection day (It must be >7 days). If you miss >2 doses, you can still keep next injection dose the same or follow de-escalation schedule which may minimize GI symptoms.   In patients with type 2 diabetes, monitor blood glucose prior to starting and during WEGOVYT treatment.   Inject your dose of Wegovy under the skin (subcutaneous injection) in your stomach area (abdomen), upper leg (thigh) or upper arm. Do not inject into a vein or a muscle. The injection site should be rotated and not given in the  same spot each day. Hold the needle under the skin and count to "10". This will allow all of the medicine to be dispensed under the skin. Always wipe your skin with an alcohol prep pad before injection  Dispose of used pen in an approved sharps container. More practical options that can be put in the trash  to go to the landfill are milk jugs or plastic laundry detergent containers with a screw on lid.  What side effects may I notice from taking WegovyT?  Side effects that usually do not require medical attention (report to our office if they continue or are bothersome): Nausea  (most common but decreases over time in most people as their body gets used to the medicine) Diarrhea Constipation (you may take an over the counter laxative if needed) Headache Decreased appetite Upset stomach Tiredness Dizziness Feeling bloated Hair loss Belching Gas Heartburn  Side effects that you should call 911 as soon as possible Vomiting Stomach pain Fever Yellowing of your skin or eyes  Clay-colored stools Increased heart rate while at rest Low blood sugar  Sudden changes in mood, behaviors, thoughts, feelings, or thoughts of suicide If you get a lump or swelling in your neck, hoarseness, trouble swallowing, or shortness of breath. Allergic reaction such as skin rash, itching, hives, swelling of the face, tongue, or lips  Helpful tips for managing nausea Nausea is a common side effect when first starting WegovyT. If you experience nausea, be sure to connect with your health care provider. He or she will offer guidance on ways to manage it, which may include: Eat bland, low-fat foods, like crackers, toast and rice  Eat foods that contain water, like soups and gelatin  Avoid lying down after you eat  Go outdoors for fresh air  Eat more slowly    Other important information Do not drop your pen or knock it against hard surfaces  Do not expose your pen to any liquids  If you think that your pen may be damaged, do not try to fix it. Use a new one Keep the pen cap on until you are ready to inject. Your pen will no longer be sterile if you store an unused pen without the cap, if you pull the pen cap off and put it on again, or if the pen cap is missing. This could lead to an infection  Store the [place] pen in the refrigerator from 39F to 39F (2C to 8C) If needed, before removing the pen cap, WegovyT can be stored from 8C to 30C (39F to 32F) in the original carton for up to 28 days.  Keep WegovyT in the original carton to protect it from light  Do not freeze   Throw away pen if WegovyT has been frozen, has been exposed to light or temperatures above 32F (30C), or has been out of the refrigerator for 28 days or longer It's important to properly dispose of your used WegovyT pens. Do not throw the pen away in your household trash. Instead, use an FDA-cleared sharps disposable container or a sturdy household container with a tight-fitting lid, like a heavy duty plastic container.   ZOXWRU pen training website: [employer]astyTh[employer].uy  Wegovy savings and support link: achegone.com    What is a GLP-1 Glucagon like peptide-1 (GLP-1) agonists represent a class of medications used to treat type 2 diabetes mellitus and obesity.  GLP-1 medications mimic the action of a hormone called glucagon like peptide 1.  When blood sugar levels start to  rise/increase these drugs stimulate the body to produce more insulin.  When that happens, the extra insulin helps to lower the blood sugar levels in the body.  This in returns helps with decreasing cravings.  These medications also slow the movement of food from the stomach into the small intestine.  This in return helps one to full faster and longer.   Diabetic medications: Approved for treatment of diabetes mellitus but does not have full approval for weight loss use Victoza (liraglutide) Ozempic (semaglutide) Mounjaro Trulicity Rybelsus  Weight loss medications: Approved for long-term weight loss use.        Saxenda (liraglutide) Wegovy (semaglutide) Zepbound Contraindications:  Pancreatitis (active gallstones) Medullary thyroid cancer High triglycerides (>500)-will need labs prior to starting Multiple Endocrine Neoplasia syndrome type 2 (MEN 2) Trying to get pregnant Breastfeeding Use with caution with taking insulin or sulfonylureas (will need to monitor blood sugars for hypoglycemia) Side effects (most  common): Most common side effects are nausea, gas, bloating and constipation.  Other possible side effects are headaches, belching, diarrhea, tiredness (fatigue), vomiting, upset stomach, dizziness, heartburn and stomach (abdominal pain).  If you think that you are becoming dehydrated, please inform our office or your primary family provider.  Stop immediately and go to ER if you have any symptoms of a serious allergic reaction including swelling of your face, lips, tongue or throat; problems breathing or swallowing; severe rash or itching; fainting or feeling dizzy; or very rapid heart rate.

## 2023-03-01 ENCOUNTER — Other Ambulatory Visit (HOSPITAL_COMMUNITY): Payer: Self-pay

## 2023-03-01 ENCOUNTER — Encounter: Payer: Self-pay | Admitting: Internal Medicine

## 2023-03-01 ENCOUNTER — Ambulatory Visit (INDEPENDENT_AMBULATORY_CARE_PROVIDER_SITE_OTHER): Payer: 59 | Admitting: Internal Medicine

## 2023-03-01 VITALS — BP 120/76 | HR 72 | Ht 63.0 in | Wt 239.6 lb

## 2023-03-01 DIAGNOSIS — R7989 Other specified abnormal findings of blood chemistry: Secondary | ICD-10-CM

## 2023-03-01 DIAGNOSIS — E038 Other specified hypothyroidism: Secondary | ICD-10-CM

## 2023-03-01 DIAGNOSIS — E063 Autoimmune thyroiditis: Secondary | ICD-10-CM | POA: Diagnosis not present

## 2023-03-01 LAB — T4, FREE: Free T4: 1.03 ng/dL (ref 0.60–1.60)

## 2023-03-01 LAB — TSH: TSH: 2.12 u[IU]/mL (ref 0.35–5.50)

## 2023-03-01 MED ORDER — LEVOTHYROXINE SODIUM 75 MCG PO TABS
75.0000 ug | ORAL_TABLET | Freq: Every day | ORAL | 3 refills | Status: DC
  Filled 2023-03-01: qty 90, 90d supply, fill #0
  Filled 2023-05-23: qty 90, 90d supply, fill #1
  Filled 2023-08-22: qty 90, 90d supply, fill #2
  Filled 2023-12-02: qty 90, 90d supply, fill #3

## 2023-03-01 NOTE — Progress Notes (Signed)
Patient ID: Krystal Kim, female   DOB: Jul 28, 1975, 47 y.o.   MRN: [id number]  HPI  Krystal Kim is a 47 y.o.-year-old female, initially referred by her gastroenterologist, Dr.Mann, returning for follow-up for Hashimoto's hypothyroidism.  Last visit 1 year ago.  Interim history: She has no complaints at today's visit. She will start [facility] soon.  She has some constipation.  Reviewed history: Patient was seen by Dr. Loreta Ave in 05/2021 for a screening colonoscopy.  At that time, she did mention occasional constipation.  During investigation for this, she was found to have a high TSH.  She was referred to endocrinology.  Patient mentions a h/o large weight fluctuations -she was able to lose weight in the past on a low calorie diet >> 60-90 lbs, however, she gained that back easily.  She has fatigue (insomnia): works a full time job and has 4 children.   She has a distant history of regular menstrual cycles and heavy bleeding.  She was on OCPs since a teenager.  She developed a DVT and PE (on OCPs) in 2017 >> could not exercise, relaxed diet >> gained weight.  Now on Pg IUD.  No depression. Some hair thinning. No cold intolerance.  After starting levothyroxine, she started to lose weight.  We started levothyroxine in 05/2021.  Latest dose increase was in 10/2021.  She takes 75 mcg daily: - in am - fasting - at least 30 min from b'fast and coffee + creamer - no calcium - no iron - no multivitamins - no PPIs - not on Biotin On vitamin D once a week.  I reviewed pt's thyroid tests: Lab Results  Component Value Date   TSH 5.820 (H) 12/12/2022   TSH 2.13 04/06/2022   TSH 7.04 (H) 02/28/2022   TSH 4.71 10/19/2021   TSH 6.29 (H) 09/02/2021   TSH 4.27 07/13/2021   TSH 7.87 (H) 06/02/2021   FREET4 1.07 12/12/2022   FREET4 0.80 04/06/2022   FREET4 0.61 02/28/2022   FREET4 0.83 10/19/2021   FREET4 0.68 09/02/2021   FREET4 0.87 07/13/2021   FREET4 0.53 (L) 06/02/2021   T3FREE 3.0  12/12/2022   T3FREE 3.4 06/02/2021  05/18/2021: TSH 10.1  Antithyroid antibodies: Component     Latest Ref Rng 06/02/2021  Thyroglobulin Ab     < or = 1 IU/mL 63 (H)   Thyroperoxidase Ab SerPl-aCnc     <9 IU/mL 27 (H)    Pt denies feeling nodules in neck, hoarseness, dysphagia/odynophagia.  She has no FH of thyroid disorders.No FH of thyroid cancer.  No h/o radiation tx to head or neck. No recent use of iodine supplements.  ROS: Constitutional: + See HPI  Past Medical History:  Diagnosis Date   Anemia    Anemia    Constipation    Hashimoto's disease    Hypothyroidism    Obesity    PE (pulmonary embolism) 10/11/2015   PONV (postoperative nausea and vomiting)    Seasonal allergies    Past Surgical History:  Procedure Laterality Date   CESAREAN SECTION     CESAREAN SECTION N/A 01/19/2014   Procedure: REPEAT CESAREAN SECTION;  Surgeon: Esmeralda Arthur, MD;  Location: [facility];  Service: Obstetrics;  Laterality: N/A;   VAGINA RECONSTRUCTION SURGERY     Social History   Socioeconomic History   Marital status: Married    Spouse name: Not on file   Number of children: 4   Years of education: Not on file   Highest education  level: Not on file  Occupational History   Occupation: Airline pilot  Tobacco Use   Smoking status: Never   Smokeless tobacco: Never  Substance and Sexual Activity   Alcohol use: Yes    Comment: Occasionally   Drug use: No   Sexual activity: Yes    Birth control/protection: None  Other Topics Concern   Not on file  Social History Narrative   Lives with husband and 3 children.  Works in Audiological scientist at [employer].   Social Determinants of Health   Financial Resource Strain: Not on file  Food Insecurity: Not on file  Transportation Needs: Not on file  Physical Activity: Not on file  Stress: Not on file  Social Connections: Not on file  Intimate Partner Violence: Not on file   Current Outpatient Medications on File Prior to Visit   Medication Sig Dispense Refill   fluconazole (DIFLUCAN) 150 MG tablet Take 1 tablet (150 mg total) by mouth now,may repeat in 3 days if needed 1 tablet 1   levonorgestrel (MIRENA, 52 MG,) 20 MCG/DAY IUD Mirena     levothyroxine (SYNTHROID) 75 MCG tablet Take 1 tablet (75 mcg total) by mouth daily before breakfast. 45 tablet 2   Semaglutide-Weight Management (WEGOVY) 0.25 MG/0.5ML SOAJ Inject 0.25 mg into the skin once a week. 2 mL 0   Vitamin D, Ergocalciferol, (DRISDOL) 1.25 MG (50000 UNIT) CAPS capsule Take 1 capsule (50,000 Units total) by mouth every 7 (seven) days. 5 capsule 0   No current facility-administered medications on file prior to visit.   Allergies  Allergen Reactions   Codeine     REACTION: Nausea and vomiting   Family History  Problem Relation Age of Onset   Diabetes Father    Obesity Father    Heart disease Maternal Grandmother    Hypertension Paternal Grandfather    Heart disease Paternal Grandfather    Hypertension Son    Diabetes Maternal Aunt    PE: BP 120/76   Pulse 72   Ht 5\' 3"  (1.6 m)   Wt 239 lb 9.6 oz (108.7 kg)   LMP  (LMP Unknown)   SpO2 96%   BMI 42.44 kg/m  Wt Readings from Last 3 Encounters:  03/01/23 239 lb 9.6 oz (108.7 kg)  02/26/23 239 lb (108.4 kg)  01/25/23 242 lb (109.8 kg)   Constitutional: overweight, in NAD Eyes:  EOMI, no exophthalmos ENT: no neck masses, no cervical lymphadenopathy Cardiovascular: RRR, No MRG Respiratory: CTA B Musculoskeletal: no deformities Skin:no rashes Neurological: no tremor with outstretched hands  ASSESSMENT: 1. Hypothyroidism due to Hashimoto's thyroiditis  2. Obesity class 3  PLAN:  1. Patient has a history of elevated TSH, found during investigation for constipation by Dr. Loreta Ave -At her first visit, she appeared euthyroid but she did describe weight fluctuations and difficulty losing weight.  At that time, she had a high TSH and a low free T4.  She also had positive thyroglobulin and TPO  antibodies, confirming Hashimoto's thyroiditis -We ended up starting levothyroxine 25 mcg daily and we then increased the dose to 50, then 75 mcg daily. - latest thyroid labs reviewed with pt. >> TSH was still elevated in 12/2022, despite the increase in dose: Lab Results  Component Value Date   TSH 5.820 (H) 12/12/2022  - she continues on LT4 75 mcg daily - pt feels good on this dose, except for some constipation.  She was able to lose 12 pounds after starting to go to the the weight  management clinic. - we discussed about taking the thyroid hormone every day, with water, >30 minutes before breakfast, separated by >4 hours from acid reflux medications, calcium, iron, multivitamins. Pt. is taking it correctly.  She only missed 1 dose in the last year. - will check thyroid tests today: TSH and fT4 - If labs are abnormal, she will need to return for repeat TFTs in 1.5 months - OTW, I will see her back in a year  2. Obesity class 3 -At last visit, she mentions not being able to lose weight -She was not diabetic or prediabetic, but had elevated triglycerides and LDL cholesterol -She reestablish care with the weight management clinic since last visit -She lost 12 pounds since establishing care with them  Needs refills to [facility].   Component     Latest Ref Rng 03/01/2023  TSH     0.35 - 5.50 uIU/mL 2.12   T4,Free(Direct)     0.60 - 1.60 ng/dL 1.61   Normal TFTs.   Carlus Pavlov, MD PhD [facility] Endocrinology

## 2023-03-01 NOTE — Patient Instructions (Addendum)
Please continue levothyroxine 75 mcg daily.  Take the thyroid hormone every day, with water, at least 30 minutes before breakfast, separated by at least 4 hours from: - acid reflux medications - calcium - iron - multivitamins  Please stop at the lab.  Please come back in 1 year, but in 6 mo for labs.

## 2023-03-02 ENCOUNTER — Other Ambulatory Visit (HOSPITAL_COMMUNITY): Payer: Self-pay

## 2023-03-14 ENCOUNTER — Other Ambulatory Visit (HOSPITAL_COMMUNITY): Payer: Self-pay

## 2023-03-15 ENCOUNTER — Other Ambulatory Visit (HOSPITAL_COMMUNITY): Payer: Self-pay

## 2023-03-27 ENCOUNTER — Telehealth: Payer: Self-pay

## 2023-03-27 ENCOUNTER — Encounter: Payer: Self-pay | Admitting: Nurse Practitioner

## 2023-03-27 ENCOUNTER — Encounter (HOSPITAL_COMMUNITY): Payer: Self-pay | Admitting: Pharmacist

## 2023-03-27 ENCOUNTER — Ambulatory Visit (INDEPENDENT_AMBULATORY_CARE_PROVIDER_SITE_OTHER): Payer: 59 | Admitting: Nurse Practitioner

## 2023-03-27 ENCOUNTER — Other Ambulatory Visit (HOSPITAL_COMMUNITY): Payer: Self-pay

## 2023-03-27 ENCOUNTER — Other Ambulatory Visit: Payer: Self-pay

## 2023-03-27 VITALS — BP 114/81 | HR 78 | Temp 98.6°F | Ht 63.0 in | Wt 231.0 lb

## 2023-03-27 DIAGNOSIS — Z6841 Body Mass Index (BMI) 40.0 and over, adult: Secondary | ICD-10-CM | POA: Diagnosis not present

## 2023-03-27 DIAGNOSIS — E039 Hypothyroidism, unspecified: Secondary | ICD-10-CM | POA: Diagnosis not present

## 2023-03-27 DIAGNOSIS — E063 Autoimmune thyroiditis: Secondary | ICD-10-CM

## 2023-03-27 DIAGNOSIS — E559 Vitamin D deficiency, unspecified: Secondary | ICD-10-CM | POA: Diagnosis not present

## 2023-03-27 MED ORDER — WEGOVY 0.5 MG/0.5ML ~~LOC~~ SOAJ
0.5000 mg | SUBCUTANEOUS | 0 refills | Status: DC
Start: 2023-03-27 — End: 2023-04-25
  Filled 2023-03-27 – 2023-04-11 (×2): qty 2, 28d supply, fill #0

## 2023-03-27 MED ORDER — VITAMIN D (ERGOCALCIFEROL) 1.25 MG (50000 UNIT) PO CAPS
50000.0000 [IU] | ORAL_CAPSULE | ORAL | 0 refills | Status: DC
  Filled 2023-03-27: qty 5, 35d supply, fill #0

## 2023-03-27 NOTE — Telephone Encounter (Signed)
PA submitted through Cover My Meds for [facility]. Awaiting insurance determination. Key: [id number]

## 2023-03-27 NOTE — Telephone Encounter (Signed)
Per CoverMyMeds:  This drug/product is not covered under the pharmacy benefit. Prior Authorization is not available.

## 2023-03-27 NOTE — Progress Notes (Signed)
Office: [phone]  /  Fax: [phone]  WEIGHT SUMMARY AND BIOMETRICS  Weight Lost Since Last Visit: 8lb  Weight Gained Since Last Visit: 0lb   Vitals Temp: 98.6 F (37 C) BP: 114/81 Pulse Rate: 78 SpO2: 100 %   Anthropometric Measurements Height: 5\' 3"  (1.6 m) Weight: 231 lb (104.8 kg) BMI (Calculated): 40.93 Weight at Last Visit: 239lb Weight Lost Since Last Visit: 8lb Weight Gained Since Last Visit: 0lb Starting Weight: 251lb Total Weight Loss (lbs): 20 lb (9.072 kg)   Body Composition  Body Fat %: 45.1 % Fat Mass (lbs): 104.2 lbs Muscle Mass (lbs): 120.45 lbs Total Body Water (lbs): 80.4 lbs Visceral Fat Rating : 13   Other Clinical Data Fasting: No Labs: No Today's Visit #: 5 Starting Date: 12/12/22     HPI  Chief Complaint: OBESITY  Krystal Kim is here to discuss her progress with her obesity treatment plan. She is on the the Category 2 Plan and states she is following her eating plan approximately 90 % of the time. She states she is exercising 0 minutes 0 days per week.   Interval History:  Since last office visit she has lost 8 pounds.  She is aiming to eat more protein.  She will sometimes substitute a protein shake for breakfast.  She is drinking water, coffee and diet soda daily.  [place] this [place].     Pharmacotherapy for weight loss: She is currently taking Wegovy 0.25mg  (started 1 week ago)  for medical weight loss.  Denies side effects.    Previous pharmacotherapy for medical weight loss:  None  Bariatric surgery:  Patient has not had bariatric surgery.   Hypothyroidism Stable.  Does not report symptoms associated with uncontrolled hypothyroidism.  Saw endo on 03/01/23. Medication(s): Levothyroxine 75 mcg daily.  Denies side effects.    Lab Results  Component Value Date   TSH 2.12 03/01/2023   Vit D deficiency  She is taking Vit D 50,000 IU weekly.  Denies side effects.  Denies nausea, vomiting or muscle  weakness.    Lab Results  Component Value Date   VD25OH 16.9 (L) 12/12/2022     PHYSICAL EXAM:  Blood pressure 114/81, pulse 78, temperature 98.6 F (37 C), height 5\' 3"  (1.6 m), weight 231 lb (104.8 kg), SpO2 100%. Body mass index is 40.92 kg/m.  General: She is overweight, cooperative, alert, well developed, and in no acute distress. PSYCH: Has normal mood, affect and thought process.   Extremities: No edema.  Neurologic: No gross sensory or motor deficits. No tremors or fasciculations noted.    DIAGNOSTIC DATA REVIEWED:  BMET    Component Value Date/Time   NA 141 12/12/2022 0940   K 4.4 12/12/2022 0940   CL 107 (H) 12/12/2022 0940   CO2 19 (L) 12/12/2022 0940   GLUCOSE 79 12/12/2022 0940   GLUCOSE 88 11/05/2015 0228   BUN 10 12/12/2022 0940   CREATININE 0.69 12/12/2022 0940   CALCIUM 8.9 12/12/2022 0940   GFRNONAA 112 07/24/2017 1138   GFRAA 130 07/24/2017 1138   Lab Results  Component Value Date   HGBA1C 5.4 12/12/2022   HGBA1C 5.4 01/20/2022   Lab Results  Component Value Date   INSULIN 18.8 12/12/2022   Lab Results  Component Value Date   TSH 2.12 03/01/2023   CBC    Component Value Date/Time   WBC 9.0 01/01/2023 0755   WBC 7.6 11/05/2015 0228   RBC 4.79 01/01/2023 0755  RBC 4.74 11/05/2015 0228   HGB 14.6 01/01/2023 0755   HCT 43.7 01/01/2023 0755   PLT 267 01/01/2023 0755   MCV 91 01/01/2023 0755   MCH 30.5 01/01/2023 0755   MCH 23.6 (L) 11/05/2015 0228   MCHC 33.4 01/01/2023 0755   MCHC 30.1 11/05/2015 0228   RDW 12.8 01/01/2023 0755   Iron Studies No results found for: "IRON", "TIBC", "FERRITIN", "IRONPCTSAT" Lipid Panel     Component Value Date/Time   CHOL 200 (H) 12/12/2022 0940   TRIG 179 (H) 12/12/2022 0940   HDL 40 12/12/2022 0940   CHOLHDL 4.2 01/20/2022 0856   LDLCALC 128 (H) 12/12/2022 0940   Hepatic Function Panel     Component Value Date/Time   PROT 7.0 12/12/2022 0940   ALBUMIN 4.2 12/12/2022 0940   AST 23  12/12/2022 0940   ALT 26 12/12/2022 0940   ALKPHOS 98 12/12/2022 0940   BILITOT 0.3 12/12/2022 0940      Component Value Date/Time   TSH 2.12 03/01/2023 0814   Nutritional Lab Results  Component Value Date   VD25OH 16.9 (L) 12/12/2022     ASSESSMENT AND PLAN  TREATMENT PLAN FOR OBESITY:  Recommended Dietary Goals  Krystal Kim is currently in the action stage of change. As such, her goal is to continue weight management plan. She has agreed to the Category 2 Plan.  Behavioral Intervention  We discussed the following Behavioral Modification Strategies today: continue to work on maintaining a reduced calorie state, getting the recommended amount of protein, incorporating whole foods, making healthy choices, staying well hydrated and practicing mindfulness when eating..  Additional resources provided today: NA  Recommended Physical Activity Goals  Krystal Kim has been advised to work up to 150 minutes of moderate intensity aerobic activity a week and strengthening exercises 2-3 times per week for cardiovascular health, weight loss maintenance and preservation of muscle mass.   She has agreed to Think about enjoyable ways to increase daily physical activity and overcoming barriers to exercise and Increase physical activity in their day and reduce sedentary time (increase NEAT).     Pharmacotherapy We discussed various medication options to help Krystal Kim with her weight loss efforts and we both agreed to continue [facility] 0.25mg  for 3 weeks and then increase to 0.5mg .  Side effects discussed.  ASSOCIATED CONDITIONS ADDRESSED TODAY  Action/Plan  Hypothyroidism due to Hashimoto's thyroiditis Continue to follow up with Endo. Continue meds as directed  Vitamin D deficiency -     Vitamin D (Ergocalciferol); Take 1 capsule (50,000 Units total) by mouth every 7 (seven) days.  Dispense: 5 capsule; Refill: 0  Morbid obesity (HCC) -     ZOXWRU; Inject 0.5 mg into the skin once a week.  Dispense: 2  mL; Refill: 0  BMI 40.0-44.9, adult (HCC)         Return in about 4 weeks (around 04/24/2023).Marland Kitchen She was informed of the importance of frequent follow up visits to maximize her success with intensive lifestyle modifications for her multiple health conditions.   ATTESTASTION STATEMENTS:  Reviewed by clinician on day of visit: allergies, medications, problem list, medical history, surgical history, family history, social history, and previous encounter notes.    [name]. Yolando Gillum FNP-C

## 2023-04-11 ENCOUNTER — Other Ambulatory Visit (HOSPITAL_COMMUNITY): Payer: Self-pay

## 2023-04-12 ENCOUNTER — Other Ambulatory Visit (HOSPITAL_COMMUNITY): Payer: Self-pay

## 2023-04-18 DIAGNOSIS — Z1330 Encounter for screening examination for mental health and behavioral disorders, unspecified: Secondary | ICD-10-CM | POA: Diagnosis not present

## 2023-04-18 DIAGNOSIS — N939 Abnormal uterine and vaginal bleeding, unspecified: Secondary | ICD-10-CM | POA: Diagnosis not present

## 2023-04-18 DIAGNOSIS — Z124 Encounter for screening for malignant neoplasm of cervix: Secondary | ICD-10-CM | POA: Diagnosis not present

## 2023-04-18 DIAGNOSIS — Z6841 Body Mass Index (BMI) 40.0 and over, adult: Secondary | ICD-10-CM | POA: Diagnosis not present

## 2023-04-18 DIAGNOSIS — Z1211 Encounter for screening for malignant neoplasm of colon: Secondary | ICD-10-CM | POA: Diagnosis not present

## 2023-04-18 DIAGNOSIS — Z3040 Encounter for surveillance of contraceptives, unspecified: Secondary | ICD-10-CM | POA: Diagnosis not present

## 2023-04-18 DIAGNOSIS — Z01419 Encounter for gynecological examination (general) (routine) without abnormal findings: Secondary | ICD-10-CM | POA: Diagnosis not present

## 2023-04-18 DIAGNOSIS — Z1231 Encounter for screening mammogram for malignant neoplasm of breast: Secondary | ICD-10-CM | POA: Diagnosis not present

## 2023-04-23 ENCOUNTER — Other Ambulatory Visit: Payer: Self-pay | Admitting: Obstetrics and Gynecology

## 2023-04-23 DIAGNOSIS — N6489 Other specified disorders of breast: Secondary | ICD-10-CM

## 2023-04-23 DIAGNOSIS — R928 Other abnormal and inconclusive findings on diagnostic imaging of breast: Secondary | ICD-10-CM

## 2023-04-25 ENCOUNTER — Ambulatory Visit (INDEPENDENT_AMBULATORY_CARE_PROVIDER_SITE_OTHER): Payer: 59 | Admitting: Nurse Practitioner

## 2023-04-25 ENCOUNTER — Encounter: Payer: Self-pay | Admitting: Nurse Practitioner

## 2023-04-25 ENCOUNTER — Other Ambulatory Visit (HOSPITAL_COMMUNITY): Payer: Self-pay

## 2023-04-25 ENCOUNTER — Other Ambulatory Visit: Payer: Self-pay

## 2023-04-25 VITALS — BP 115/77 | HR 83 | Temp 98.2°F | Ht 63.0 in | Wt 225.0 lb

## 2023-04-25 DIAGNOSIS — Z6839 Body mass index (BMI) 39.0-39.9, adult: Secondary | ICD-10-CM

## 2023-04-25 DIAGNOSIS — E063 Autoimmune thyroiditis: Secondary | ICD-10-CM

## 2023-04-25 MED ORDER — WEGOVY 0.5 MG/0.5ML ~~LOC~~ SOAJ
0.5000 mg | SUBCUTANEOUS | 0 refills | Status: DC
  Filled 2023-04-25: qty 2, 28d supply, fill #0

## 2023-04-25 NOTE — Progress Notes (Signed)
Office: [phone]  /  Fax: [phone]  WEIGHT SUMMARY AND BIOMETRICS  Weight Lost Since Last Visit: 6lb  Weight Gained Since Last Visit: 0lb   Vitals Temp: 98.2 F (36.8 C) BP: 115/77 Pulse Rate: 83 SpO2: 99 %   Anthropometric Measurements Height: 5\' 3"  (1.6 m) Weight: 225 lb (102.1 kg) BMI (Calculated): 39.87 Weight at Last Visit: 231lb Weight Lost Since Last Visit: 6lb Weight Gained Since Last Visit: 0lb Starting Weight: 251lb Total Weight Loss (lbs): 26 lb (11.8 kg)   Body Composition  Body Fat %: 44.4 % Fat Mass (lbs): 100 lbs Muscle Mass (lbs): 118.6 lbs Total Body Water (lbs): 79.6 lbs Visceral Fat Rating : 13   Other Clinical Data Fasting: No Labs: No Today's Visit #: 6 Starting Date: 12/12/22     HPI  Chief Complaint: OBESITY  Krystal Kim is here to discuss her progress with her obesity treatment plan. She is on the the Category 2 Plan and states she is following her eating plan approximately 90-95 % of the time. She states she is exercising varying minutes 3 days per week.   Interval History:  Since last office visit she has lost 6 pounds.  She went to [facility] since her last visit.  She has increased her walking and resistance training. Working on [employer] intake.  She sometimes substitutes a protein shake as a meal replacement.  Rarely struggles with polyphagia or cravings since starting Wegovy.     Pharmacotherapy for weight loss: She is currently taking Wegovy 0.5mg  (x 2 doses) for medical weight loss.  Reports some side effects of reflux based upon what she eats.      Previous pharmacotherapy for medical weight loss:  none  Bariatric surgery:  Patient has not had bariatric surgery.    Hypothyroidism Stable.  Does not report symptoms associated with uncontrolled hypothyroidism.  Saw Endo last on 03/01/23 Medication(s): Levothyroxine 75 mcg daily. Lab Results  Component Value Date   TSH 2.12 03/01/2023      PHYSICAL  EXAM:  Blood pressure 115/77, pulse 83, temperature 98.2 F (36.8 C), height 5\' 3"  (1.6 m), weight 225 lb (102.1 kg), SpO2 99%. Body mass index is 39.86 kg/m.  General: She is overweight, cooperative, alert, well developed, and in no acute distress. PSYCH: Has normal mood, affect and thought process.   Extremities: No edema.  Neurologic: No gross sensory or motor deficits. No tremors or fasciculations noted.    DIAGNOSTIC DATA REVIEWED:  BMET    Component Value Date/Time   NA 141 12/12/2022 0940   K 4.4 12/12/2022 0940   CL 107 (H) 12/12/2022 0940   CO2 19 (L) 12/12/2022 0940   GLUCOSE 79 12/12/2022 0940   GLUCOSE 88 11/05/2015 0228   BUN 10 12/12/2022 0940   CREATININE 0.69 12/12/2022 0940   CALCIUM 8.9 12/12/2022 0940   GFRNONAA 112 07/24/2017 1138   GFRAA 130 07/24/2017 1138   Lab Results  Component Value Date   HGBA1C 5.4 12/12/2022   HGBA1C 5.4 01/20/2022   Lab Results  Component Value Date   INSULIN 18.8 12/12/2022   Lab Results  Component Value Date   TSH 2.12 03/01/2023   CBC    Component Value Date/Time   WBC 9.0 01/01/2023 0755   WBC 7.6 11/05/2015 0228   RBC 4.79 01/01/2023 0755   RBC 4.74 11/05/2015 0228   HGB 14.6 01/01/2023 0755   HCT 43.7 01/01/2023 0755   PLT 267 01/01/2023 0755   MCV 91 01/01/2023 0755  MCH 30.5 01/01/2023 0755   MCH 23.6 (L) 11/05/2015 0228   MCHC 33.4 01/01/2023 0755   MCHC 30.1 11/05/2015 0228   RDW 12.8 01/01/2023 0755   Iron Studies No results found for: "IRON", "TIBC", "FERRITIN", "IRONPCTSAT" Lipid Panel     Component Value Date/Time   CHOL 200 (H) 12/12/2022 0940   TRIG 179 (H) 12/12/2022 0940   HDL 40 12/12/2022 0940   CHOLHDL 4.2 01/20/2022 0856   LDLCALC 128 (H) 12/12/2022 0940   Hepatic Function Panel     Component Value Date/Time   PROT 7.0 12/12/2022 0940   ALBUMIN 4.2 12/12/2022 0940   AST 23 12/12/2022 0940   ALT 26 12/12/2022 0940   ALKPHOS 98 12/12/2022 0940   BILITOT 0.3 12/12/2022 0940       Component Value Date/Time   TSH 2.12 03/01/2023 0814   Nutritional Lab Results  Component Value Date   VD25OH 16.9 (L) 12/12/2022     ASSESSMENT AND PLAN  TREATMENT PLAN FOR OBESITY:  Recommended Dietary Goals  Generra is currently in the action stage of change. As such, her goal is to continue weight management plan. She has agreed to the Category 2 Plan.  Behavioral Intervention  We discussed the following Behavioral Modification Strategies today: continue to work on maintaining a reduced calorie state, getting the recommended amount of protein, incorporating whole foods, making healthy choices, staying well hydrated and practicing mindfulness when eating..  Additional resources provided today: NA  Recommended Physical Activity Goals  Yessica has been advised to work up to 150 minutes of moderate intensity aerobic activity a week and strengthening exercises 2-3 times per week for cardiovascular health, weight loss maintenance and preservation of muscle mass.   She has agreed to Think about enjoyable ways to increase daily physical activity and overcoming barriers to exercise, Increase physical activity in their day and reduce sedentary time (increase NEAT)., Increase the intensity, frequency or duration of strengthening exercises , and Increase the intensity, frequency or duration of aerobic exercises     Pharmacotherapy We discussed various medication options to help Evoleht with her weight loss efforts and we both agreed to continue Wegovy 0.5 mg.  Side effects discussed.    ASSOCIATED CONDITIONS ADDRESSED TODAY  Action/Plan  Hypothyroidism due to Hashimoto's thyroiditis Continue to follow up with Endo.  Continue med as directed.   Morbid obesity (HCC) -     WGNFAO; Inject 0.5 mg into the skin once a week.  Dispense: 2 mL; Refill: 0  BMI 39.0-39.9,adult         Return in about 4 weeks (around 05/23/2023).Marland Kitchen She was informed of the importance of frequent follow  up visits to maximize her success with intensive lifestyle modifications for her multiple health conditions.   ATTESTASTION STATEMENTS:  Reviewed by clinician on day of visit: allergies, medications, problem list, medical history, surgical history, family history, social history, and previous encounter notes.     [name]. Junetta Hearn FNP-C

## 2023-05-14 ENCOUNTER — Ambulatory Visit
Admission: RE | Admit: 2023-05-14 | Discharge: 2023-05-14 | Disposition: A | Discharge status: Home / Self Care | Payer: 59 | Source: Ambulatory Visit | Attending: Obstetrics and Gynecology | Admitting: Obstetrics and Gynecology

## 2023-05-14 ENCOUNTER — Ambulatory Visit: Payer: 59

## 2023-05-14 DIAGNOSIS — R928 Other abnormal and inconclusive findings on diagnostic imaging of breast: Secondary | ICD-10-CM | POA: Diagnosis not present

## 2023-05-14 DIAGNOSIS — Z113 Encounter for screening for infections with a predominantly sexual mode of transmission: Secondary | ICD-10-CM | POA: Diagnosis not present

## 2023-05-14 DIAGNOSIS — Z30432 Encounter for removal of intrauterine contraceptive device: Secondary | ICD-10-CM | POA: Diagnosis not present

## 2023-05-14 DIAGNOSIS — Z30430 Encounter for insertion of intrauterine contraceptive device: Secondary | ICD-10-CM | POA: Diagnosis not present

## 2023-05-21 DIAGNOSIS — Z30431 Encounter for routine checking of intrauterine contraceptive device: Secondary | ICD-10-CM | POA: Diagnosis not present

## 2023-05-23 ENCOUNTER — Ambulatory Visit (INDEPENDENT_AMBULATORY_CARE_PROVIDER_SITE_OTHER): Payer: 59 | Admitting: Nurse Practitioner

## 2023-05-23 ENCOUNTER — Other Ambulatory Visit: Payer: Self-pay

## 2023-05-23 ENCOUNTER — Encounter: Payer: Self-pay | Admitting: Nurse Practitioner

## 2023-05-23 VITALS — BP 114/76 | HR 80 | Temp 98.1°F | Ht 63.0 in | Wt 215.0 lb

## 2023-05-23 DIAGNOSIS — Z6838 Body mass index (BMI) 38.0-38.9, adult: Secondary | ICD-10-CM

## 2023-05-23 DIAGNOSIS — E559 Vitamin D deficiency, unspecified: Secondary | ICD-10-CM

## 2023-05-23 MED ORDER — VITAMIN D (ERGOCALCIFEROL) 1.25 MG (50000 UNIT) PO CAPS
50000.0000 [IU] | ORAL_CAPSULE | ORAL | 0 refills | Status: DC
  Filled 2023-05-23: qty 5, 35d supply, fill #0

## 2023-05-23 MED ORDER — WEGOVY 0.5 MG/0.5ML ~~LOC~~ SOAJ
0.5000 mg | SUBCUTANEOUS | 0 refills | Status: DC
  Filled 2023-05-23: qty 2, 28d supply, fill #0

## 2023-05-23 NOTE — Progress Notes (Signed)
Office: [phone]  /  Fax: [phone]  WEIGHT SUMMARY AND BIOMETRICS  Weight Lost Since Last Visit: 10lb  Weight Gained Since Last Visit: 0lb   Vitals Temp: 98.1 F (36.7 C) BP: 114/76 Pulse Rate: 80 SpO2: 99 %   Anthropometric Measurements Height: 5\' 3"  (1.6 m) Weight: 215 lb (97.5 kg) BMI (Calculated): 38.09 Weight at Last Visit: 225lb Weight Lost Since Last Visit: 10lb Weight Gained Since Last Visit: 0lb Starting Weight: 251lb Total Weight Loss (lbs): 36 lb (16.3 kg)   Body Composition  Body Fat %: 43.5 % Fat Mass (lbs): 93.6 lbs Muscle Mass (lbs): 115.6 lbs Total Body Water (lbs): 78 lbs Visceral Fat Rating : 12   Other Clinical Data Fasting: No Labs: No Today's Visit #: 7 Starting Date: 12/12/22     HPI  Chief Complaint: OBESITY  Krystal Kim is here to discuss her progress with her obesity treatment plan. She is on the the Category 2 Plan and states she is following her eating plan approximately 85 % of the time. She states she is exercising 15 minutes 3 days per week.   Interval History:  Since last office visit she has lost 10 pounds.  She is focusing on eating more protein.  At times, she struggles with eating all her calories.  She drinks a protein shake daily.  She is drinking water, coffee and a diet soda daily.   She is going to [place] Dec 27th for 10 days.    Pharmacotherapy for weight loss: She is currently taking Wegovy 0.5mg  for medical weight loss.  Denies side effects.     Previous pharmacotherapy for medical weight loss:  none   Bariatric surgery:  Patient has not had bariatric surgery.  Vit D deficiency  She is taking Vit D 50,000 IU weekly.  Denies side effects.  Denies nausea, vomiting or muscle weakness.    Lab Results  Component Value Date   VD25OH 16.9 (L) 12/12/2022     PHYSICAL EXAM:  Blood pressure 114/76, pulse 80, temperature 98.1 F (36.7 C), height 5\' 3"  (1.6 m), weight 215 lb (97.5 kg), SpO2 99%. Body mass  index is 38.09 kg/m.  General: She is overweight, cooperative, alert, well developed, and in no acute distress. PSYCH: Has normal mood, affect and thought process.   Extremities: No edema.  Neurologic: No gross sensory or motor deficits. No tremors or fasciculations noted.    DIAGNOSTIC DATA REVIEWED:  BMET    Component Value Date/Time   NA 141 12/12/2022 0940   K 4.4 12/12/2022 0940   CL 107 (H) 12/12/2022 0940   CO2 19 (L) 12/12/2022 0940   GLUCOSE 79 12/12/2022 0940   GLUCOSE 88 11/05/2015 0228   BUN 10 12/12/2022 0940   CREATININE 0.69 12/12/2022 0940   CALCIUM 8.9 12/12/2022 0940   GFRNONAA 112 07/24/2017 1138   GFRAA 130 07/24/2017 1138   Lab Results  Component Value Date   HGBA1C 5.4 12/12/2022   HGBA1C 5.4 01/20/2022   Lab Results  Component Value Date   INSULIN 18.8 12/12/2022   Lab Results  Component Value Date   TSH 2.12 03/01/2023   CBC    Component Value Date/Time   WBC 9.0 01/01/2023 0755   WBC 7.6 11/05/2015 0228   RBC 4.79 01/01/2023 0755   RBC 4.74 11/05/2015 0228   HGB 14.6 01/01/2023 0755   HCT 43.7 01/01/2023 0755   PLT 267 01/01/2023 0755   MCV 91 01/01/2023 0755   MCH 30.5 01/01/2023 0755  MCH 23.6 (L) 11/05/2015 0228   MCHC 33.4 01/01/2023 0755   MCHC 30.1 11/05/2015 0228   RDW 12.8 01/01/2023 0755   Iron Studies No results found for: "IRON", "TIBC", "FERRITIN", "IRONPCTSAT" Lipid Panel     Component Value Date/Time   CHOL 200 (H) 12/12/2022 0940   TRIG 179 (H) 12/12/2022 0940   HDL 40 12/12/2022 0940   CHOLHDL 4.2 01/20/2022 0856   LDLCALC 128 (H) 12/12/2022 0940   Hepatic Function Panel     Component Value Date/Time   PROT 7.0 12/12/2022 0940   ALBUMIN 4.2 12/12/2022 0940   AST 23 12/12/2022 0940   ALT 26 12/12/2022 0940   ALKPHOS 98 12/12/2022 0940   BILITOT 0.3 12/12/2022 0940      Component Value Date/Time   TSH 2.12 03/01/2023 0814   Nutritional Lab Results  Component Value Date   VD25OH 16.9 (L)  12/12/2022     ASSESSMENT AND PLAN  TREATMENT PLAN FOR OBESITY:  Recommended Dietary Goals  Krystal Kim is currently in the action stage of change. As such, her goal is to continue weight management plan. She has agreed to the Category 2 Plan.  Behavioral Intervention  We discussed the following Behavioral Modification Strategies today: increasing lean protein intake to established goals, increasing water intake , celebration eating strategies, and continue to work on maintaining a reduced calorie state, getting the recommended amount of protein, incorporating whole foods, making healthy choices, staying well hydrated and practicing mindfulness when eating..  Additional resources provided today: NA  Recommended Physical Activity Goals  Krystal Kim has been advised to work up to 150 minutes of moderate intensity aerobic activity a week and strengthening exercises 2-3 times per week for cardiovascular health, weight loss maintenance and preservation of muscle mass.   She has agreed to Think about enjoyable ways to increase daily physical activity and overcoming barriers to exercise, Increase physical activity in their day and reduce sedentary time (increase NEAT)., Increase the intensity, frequency or duration of strengthening exercises , and Increase the intensity, frequency or duration of aerobic exercises     Pharmacotherapy We discussed various medication options to help Krystal Kim with her weight loss efforts and we both agreed to continue [facility] 0.5mg .  side effects discussed.  ASSOCIATED CONDITIONS ADDRESSED TODAY  Action/Plan  Vitamin D deficiency -     Vitamin D (Ergocalciferol); Take 1 capsule (50,000 Units total) by mouth every 7 (seven) days.  Dispense: 5 capsule; Refill: 0  Morbid obesity (HCC) -     HQIONG; Inject 0.5 mg into the skin once a week.  Dispense: 2 mL; Refill: 0  BMI 38.0-38.9,adult         Return in about 4 weeks (around 06/20/2023).Marland Kitchen She was informed of the  importance of frequent follow up visits to maximize her success with intensive lifestyle modifications for her multiple health conditions.   ATTESTASTION STATEMENTS:  Reviewed by clinician on day of visit: allergies, medications, problem list, medical history, surgical history, family history, social history, and previous encounter notes.     [name]. Krystal Fuhrmann FNP-C

## 2023-06-22 ENCOUNTER — Other Ambulatory Visit (HOSPITAL_COMMUNITY): Payer: Self-pay

## 2023-06-27 ENCOUNTER — Encounter: Payer: Self-pay | Admitting: Nurse Practitioner

## 2023-06-27 ENCOUNTER — Ambulatory Visit: Payer: 59 | Admitting: Nurse Practitioner

## 2023-06-27 ENCOUNTER — Other Ambulatory Visit: Payer: Self-pay

## 2023-06-27 VITALS — BP 117/73 | HR 74 | Temp 98.2°F | Ht 63.0 in | Wt 209.0 lb

## 2023-06-27 DIAGNOSIS — E559 Vitamin D deficiency, unspecified: Secondary | ICD-10-CM

## 2023-06-27 DIAGNOSIS — Z6837 Body mass index (BMI) 37.0-37.9, adult: Secondary | ICD-10-CM | POA: Diagnosis not present

## 2023-06-27 MED ORDER — VITAMIN D (ERGOCALCIFEROL) 1.25 MG (50000 UNIT) PO CAPS
50000.0000 [IU] | ORAL_CAPSULE | ORAL | 0 refills | Status: DC
  Filled 2023-06-27: qty 5, 35d supply, fill #0

## 2023-06-27 MED ORDER — WEGOVY 0.5 MG/0.5ML ~~LOC~~ SOAJ
0.5000 mg | SUBCUTANEOUS | 0 refills | Status: DC
  Filled 2023-06-27: qty 2, 28d supply, fill #0

## 2023-06-27 NOTE — Progress Notes (Signed)
 Office: [phone]  /  Fax: [phone]  WEIGHT SUMMARY AND BIOMETRICS  Weight Lost Since Last Visit: 6lb  Weight Gained Since Last Visit: 0lb   Vitals Temp: 98.2 F (36.8 C) BP: 117/73 Pulse Rate: 74 SpO2: 98 %   Anthropometric Measurements Height: 5\' 3"  (1.6 m) Weight: 209 lb (94.8 kg) BMI (Calculated): 37.03 Weight at Last Visit: 215lb Weight Lost Since Last Visit: 6lb Weight Gained Since Last Visit: 0lb Starting Weight: 251lb Total Weight Loss (lbs): 42 lb (19.1 kg)   Body Composition  Body Fat %: 43 % Fat Mass (lbs): 90.2 lbs Muscle Mass (lbs): 113.4 lbs Total Body Water (lbs): 76.6 lbs Visceral Fat Rating : 11   Other Clinical Data Fasting: No Labs: No Today's Visit #: 8 Starting Date: 12/12/22     HPI  Chief Complaint: OBESITY  Krystal Kim is here to discuss her progress with her obesity treatment plan. She is on the the Category 2 Plan and states she is following her eating plan approximately 65 % of the time. She states she is exercising 20 minutes 3-4 days per week.   Interval History:  Since last office visit she has lost 6 pounds.  She went to [place] since her last visit. She is not skipping meals.  She is eating eggs or a protein shake for breakfast, lean cuisine and a fruit for lunch and dinner a protein and a vegetables.  She is drinking coffee, seltzer water, diet soda and water daily.   Pharmacotherapy for weight loss: She is currently taking Wegovy  0.5mg  for medical weight loss.  Denies side effects.     Previous pharmacotherapy for medical weight loss:  none   Bariatric surgery:  Patient has not had bariatric surgery.  Vit D deficiency  She is taking Vit D 50,000 IU weekly.  Denies side effects.  Denies nausea, vomiting or muscle weakness.    Lab Results  Component Value Date   VD25OH 16.9 (L) 12/12/2022      PHYSICAL EXAM:  Blood pressure 117/73, pulse 74, temperature 98.2 F (36.8 C), height 5\' 3"  (1.6 m), weight 209 lb  (94.8 kg), SpO2 98%. Body mass index is 37.02 kg/m.  General: She is overweight, cooperative, alert, well developed, and in no acute distress. PSYCH: Has normal mood, affect and thought process.   Extremities: No edema.  Neurologic: No gross sensory or motor deficits. No tremors or fasciculations noted.    DIAGNOSTIC DATA REVIEWED:  BMET    Component Value Date/Time   NA 141 12/12/2022 0940   K 4.4 12/12/2022 0940   CL 107 (H) 12/12/2022 0940   CO2 19 (L) 12/12/2022 0940   GLUCOSE 79 12/12/2022 0940   GLUCOSE 88 11/05/2015 0228   BUN 10 12/12/2022 0940   CREATININE 0.69 12/12/2022 0940   CALCIUM 8.9 12/12/2022 0940   GFRNONAA 112 07/24/2017 1138   GFRAA 130 07/24/2017 1138   Lab Results  Component Value Date   HGBA1C 5.4 12/12/2022   HGBA1C 5.4 01/20/2022   Lab Results  Component Value Date   INSULIN  18.8 12/12/2022   Lab Results  Component Value Date   TSH 2.12 03/01/2023   CBC    Component Value Date/Time   WBC 9.0 01/01/2023 0755   WBC 7.6 11/05/2015 0228   RBC 4.79 01/01/2023 0755   RBC 4.74 11/05/2015 0228   HGB 14.6 01/01/2023 0755   HCT 43.7 01/01/2023 0755   PLT 267 01/01/2023 0755   MCV 91 01/01/2023 0755   MCH  30.5 01/01/2023 0755   MCH 23.6 (L) 11/05/2015 0228   MCHC 33.4 01/01/2023 0755   MCHC 30.1 11/05/2015 0228   RDW 12.8 01/01/2023 0755   Iron Studies No results found for: "IRON", "TIBC", "FERRITIN", "IRONPCTSAT" Lipid Panel     Component Value Date/Time   CHOL 200 (H) 12/12/2022 0940   TRIG 179 (H) 12/12/2022 0940   HDL 40 12/12/2022 0940   CHOLHDL 4.2 01/20/2022 0856   LDLCALC 128 (H) 12/12/2022 0940   Hepatic Function Panel     Component Value Date/Time   PROT 7.0 12/12/2022 0940   ALBUMIN 4.2 12/12/2022 0940   AST 23 12/12/2022 0940   ALT 26 12/12/2022 0940   ALKPHOS 98 12/12/2022 0940   BILITOT 0.3 12/12/2022 0940      Component Value Date/Time   TSH 2.12 03/01/2023 0814   Nutritional Lab Results  Component Value  Date   VD25OH 16.9 (L) 12/12/2022     ASSESSMENT AND PLAN  TREATMENT PLAN FOR OBESITY:  Recommended Dietary Goals  Cherolyn is currently in the action stage of change. As such, her goal is to continue weight management plan. She has agreed to the Category 2 Plan.  Behavioral Intervention  We discussed the following Behavioral Modification Strategies today: increasing lean protein intake to established goals, decreasing simple carbohydrates , increasing vegetables, increasing fiber rich foods, increasing water intake , work on meal planning and preparation, reading food labels , keeping healthy foods at home, and continue to work on maintaining a reduced calorie state, getting the recommended amount of protein, incorporating whole foods, making healthy choices, staying well hydrated and practicing mindfulness when eating..  Additional resources provided today: NA  Recommended Physical Activity Goals  Lucile has been advised to work up to 150 minutes of moderate intensity aerobic activity a week and strengthening exercises 2-3 times per week for cardiovascular health, weight loss maintenance and preservation of muscle mass.   She has agreed to Think about enjoyable ways to increase daily physical activity and overcoming barriers to exercise, Increase physical activity in their day and reduce sedentary time (increase NEAT)., Increase the intensity, frequency or duration of strengthening exercises , and Increase the intensity, frequency or duration of aerobic exercises     Pharmacotherapy We discussed various medication options to help Eyvette with her weight loss efforts and we both agreed to continue Wegovy  0.5mg .  Side effects discussed.  ASSOCIATED CONDITIONS ADDRESSED TODAY  Action/Plan  Vitamin D  deficiency -     Vitamin D  (Ergocalciferol ); Take 1 capsule (50,000 Units total) by mouth every 7 (seven) days.  Dispense: 5 capsule; Refill: 0  Morbid obesity (HCC) -     Wegovy ; Inject  0.5 mg into the skin once a week.  Dispense: 2 mL; Refill: 0  BMI 37.0-37.9, adult      Will check fasting labs at next visit   Return in about 4 weeks (around 07/25/2023).Aaron Aas She was informed of the importance of frequent follow up visits to maximize her success with intensive lifestyle modifications for her multiple health conditions.   ATTESTASTION STATEMENTS:  Reviewed by clinician on day of visit: allergies, medications, problem list, medical history, surgical history, family history, social history, and previous encounter notes.     Crist Dominion. Ruble Buttler FNP-C

## 2023-07-17 ENCOUNTER — Ambulatory Visit: Payer: 59 | Admitting: Dermatology

## 2023-07-18 ENCOUNTER — Ambulatory Visit: Payer: 59 | Admitting: Dermatology

## 2023-07-23 ENCOUNTER — Other Ambulatory Visit (HOSPITAL_COMMUNITY): Payer: Self-pay

## 2023-07-23 ENCOUNTER — Other Ambulatory Visit (HOSPITAL_BASED_OUTPATIENT_CLINIC_OR_DEPARTMENT_OTHER): Payer: Self-pay

## 2023-07-25 ENCOUNTER — Ambulatory Visit (INDEPENDENT_AMBULATORY_CARE_PROVIDER_SITE_OTHER): Payer: 59 | Admitting: Nurse Practitioner

## 2023-07-25 ENCOUNTER — Other Ambulatory Visit: Payer: Self-pay

## 2023-07-25 ENCOUNTER — Encounter: Payer: Self-pay | Admitting: Nurse Practitioner

## 2023-07-25 ENCOUNTER — Other Ambulatory Visit (HOSPITAL_COMMUNITY): Payer: Self-pay

## 2023-07-25 VITALS — BP 112/76 | HR 75 | Temp 97.9°F | Ht 63.0 in | Wt 205.0 lb

## 2023-07-25 DIAGNOSIS — E7849 Other hyperlipidemia: Secondary | ICD-10-CM

## 2023-07-25 DIAGNOSIS — Z79899 Other long term (current) drug therapy: Secondary | ICD-10-CM | POA: Diagnosis not present

## 2023-07-25 DIAGNOSIS — E66812 Obesity, class 2: Secondary | ICD-10-CM | POA: Diagnosis not present

## 2023-07-25 DIAGNOSIS — E063 Autoimmune thyroiditis: Secondary | ICD-10-CM

## 2023-07-25 DIAGNOSIS — E559 Vitamin D deficiency, unspecified: Secondary | ICD-10-CM | POA: Diagnosis not present

## 2023-07-25 DIAGNOSIS — Z6836 Body mass index (BMI) 36.0-36.9, adult: Secondary | ICD-10-CM | POA: Diagnosis not present

## 2023-07-25 DIAGNOSIS — Z862 Personal history of diseases of the blood and blood-forming organs and certain disorders involving the immune mechanism: Secondary | ICD-10-CM

## 2023-07-25 MED ORDER — VITAMIN D (ERGOCALCIFEROL) 1.25 MG (50000 UNIT) PO CAPS
50000.0000 [IU] | ORAL_CAPSULE | ORAL | 0 refills | Status: DC
  Filled 2023-07-25: qty 5, 35d supply, fill #0

## 2023-07-25 MED ORDER — WEGOVY 0.5 MG/0.5ML ~~LOC~~ SOAJ
0.5000 mg | SUBCUTANEOUS | 0 refills | Status: DC
  Filled 2023-07-25: qty 2, 28d supply, fill #0

## 2023-07-25 NOTE — Progress Notes (Signed)
Office: [phone]  /  Fax: [phone]  WEIGHT SUMMARY AND BIOMETRICS  Weight Lost Since Last Visit: 4lb  Weight Gained Since Last Visit: 0lb   Vitals Temp: 97.9 F (36.6 C) BP: 112/76 Pulse Rate: 75 SpO2: 98 %   Anthropometric Measurements Height: 5\' 3"  (1.6 m) Weight: 205 lb (93 kg) BMI (Calculated): 36.32 Weight at Last Visit: 209lb Weight Lost Since Last Visit: 4lb Weight Gained Since Last Visit: 0lb Starting Weight: 251lb Total Weight Loss (lbs): 46 lb (20.9 kg)   Body Composition  Body Fat %: 42.3 % Fat Mass (lbs): 86.8 lbs Muscle Mass (lbs): 112.6 lbs Total Body Water (lbs): 75.8 lbs Visceral Fat Rating : 11   Other Clinical Data Fasting: yes Today's Visit #: 9 Starting Date: 12/12/22     HPI  Chief Complaint: OBESITY  Krystal Kim is here to discuss her progress with her obesity treatment plan. She is on the the Category 2 Plan and states she is following her eating plan approximately 90 % of the time. She states she is exercising 15 minutes 3 days per week.  Has increased her weights with resistance training.    Interval History:  Since last office visit she has lost 4 pounds. She went to [facility] since her last visit. She feels that things are going well. She is not skipping meals.  She is eating a protein with each meal.  She substitutes a protein shake for a meal during the work week.  She notes cravings when her next injection is due.  She is drinking water, diet soda and coffee daily.      Pharmacotherapy for weight loss: She is currently taking Wegovy 0.5mg  for medical weight loss.  Denies side effects.     Previous pharmacotherapy for medical weight loss:  none   Bariatric surgery:  Patient has not had bariatric surgery.  Hypothyroidism Stable.  Does not report symptoms associated with uncontrolled hypothyroidism.  She saw endo last on 03/01/23 Medication(s): Levothyroxine 75 mcg daily.  Denies side effects  Lab Results  Component Value  Date   TSH 2.12 03/01/2023    Hyperlipidemia Medication(s): none.   Lab Results  Component Value Date   CHOL 200 (H) 12/12/2022   HDL 40 12/12/2022   LDLCALC 128 (H) 12/12/2022   TRIG 179 (H) 12/12/2022   CHOLHDL 4.2 01/20/2022   Lab Results  Component Value Date   ALT 26 12/12/2022   AST 23 12/12/2022   ALKPHOS 98 12/12/2022   BILITOT 0.3 12/12/2022   The 10-year ASCVD risk score (Arnett DK, et al., 2019) is: 1.2%   Values used to calculate the score:     Age: 48 years     Sex: Female     Is Non-Hispanic African American: No     Diabetic: No     Tobacco smoker: No     Systolic Blood Pressure: 112 mmHg     Is BP treated: No     HDL Cholesterol: 40 mg/dL     Total Cholesterol: 200 mg/dL   History of anemia Not currently on a MVI.  Denies fatigue.    Vit D deficiency  She is taking Vit D 50,000 IU weekly.  Denies side effects.  Denies nausea, vomiting or muscle weakness.    Lab Results  Component Value Date   VD25OH 16.9 (L) 12/12/2022     PHYSICAL EXAM:  Blood pressure 112/76, pulse 75, temperature 97.9 F (36.6 C), height 5\' 3"  (1.6 m), weight 205 lb (93  kg), SpO2 98%. Body mass index is 36.31 kg/m.  General: She is overweight, cooperative, alert, well developed, and in no acute distress. PSYCH: Has normal mood, affect and thought process.   Extremities: No edema.  Neurologic: No gross sensory or motor deficits. No tremors or fasciculations noted.    DIAGNOSTIC DATA REVIEWED:  BMET    Component Value Date/Time   NA 141 12/12/2022 0940   K 4.4 12/12/2022 0940   CL 107 (H) 12/12/2022 0940   CO2 19 (L) 12/12/2022 0940   GLUCOSE 79 12/12/2022 0940   GLUCOSE 88 11/05/2015 0228   BUN 10 12/12/2022 0940   CREATININE 0.69 12/12/2022 0940   CALCIUM 8.9 12/12/2022 0940   GFRNONAA 112 07/24/2017 1138   GFRAA 130 07/24/2017 1138   Lab Results  Component Value Date   HGBA1C 5.4 12/12/2022   HGBA1C 5.4 01/20/2022   Lab Results  Component Value Date    INSULIN 18.8 12/12/2022   Lab Results  Component Value Date   TSH 2.12 03/01/2023   CBC    Component Value Date/Time   WBC 9.0 01/01/2023 0755   WBC 7.6 11/05/2015 0228   RBC 4.79 01/01/2023 0755   RBC 4.74 11/05/2015 0228   HGB 14.6 01/01/2023 0755   HCT 43.7 01/01/2023 0755   PLT 267 01/01/2023 0755   MCV 91 01/01/2023 0755   MCH 30.5 01/01/2023 0755   MCH 23.6 (L) 11/05/2015 0228   MCHC 33.4 01/01/2023 0755   MCHC 30.1 11/05/2015 0228   RDW 12.8 01/01/2023 0755   Iron Studies No results found for: "IRON", "TIBC", "FERRITIN", "IRONPCTSAT" Lipid Panel     Component Value Date/Time   CHOL 200 (H) 12/12/2022 0940   TRIG 179 (H) 12/12/2022 0940   HDL 40 12/12/2022 0940   CHOLHDL 4.2 01/20/2022 0856   LDLCALC 128 (H) 12/12/2022 0940   Hepatic Function Panel     Component Value Date/Time   PROT 7.0 12/12/2022 0940   ALBUMIN 4.2 12/12/2022 0940   AST 23 12/12/2022 0940   ALT 26 12/12/2022 0940   ALKPHOS 98 12/12/2022 0940   BILITOT 0.3 12/12/2022 0940      Component Value Date/Time   TSH 2.12 03/01/2023 0814   Nutritional Lab Results  Component Value Date   VD25OH 16.9 (L) 12/12/2022     ASSESSMENT AND PLAN  TREATMENT PLAN FOR OBESITY:  Recommended Dietary Goals  Krystal Kim is currently in the action stage of change. As such, her goal is to continue weight management plan. She has agreed to the Category 2 Plan.  Behavioral Intervention  We discussed the following Behavioral Modification Strategies today: increasing lean protein intake to established goals, decreasing simple carbohydrates , increasing fiber rich foods, increasing water intake , reading food labels , keeping healthy foods at home, continue to work on implementation of reduced calorie nutritional plan, continue to practice mindfulness when eating, and continue to work on maintaining a reduced calorie state, getting the recommended amount of protein, incorporating whole foods, making healthy  choices, staying well hydrated and practicing mindfulness when eating..  Additional resources provided today: NA  Recommended Physical Activity Goals  Krystal Kim has been advised to work up to 150 minutes of moderate intensity aerobic activity a week and strengthening exercises 2-3 times per week for cardiovascular health, weight loss maintenance and preservation of muscle mass.   She has agreed to Think about enjoyable ways to increase daily physical activity and overcoming barriers to exercise, Increase physical activity in their day and  reduce sedentary time (increase NEAT)., Increase the intensity, frequency or duration of strengthening exercises , and Increase the intensity, frequency or duration of aerobic exercises     Pharmacotherapy We discussed various medication options to help Krystal Kim with her weight loss efforts and we both agreed to continue [facility] 0.5mg .  Side effects discussed.  ASSOCIATED CONDITIONS ADDRESSED TODAY  Action/Plan  Hypothyroidism due to Hashimoto's thyroiditis -     TSH -     T4, free Continue follow-up with endocrinology.  Continue medications as directed.  Vitamin D deficiency -     VITAMIN D 25 Hydroxy (Vit-D Deficiency, Fractures)  Continue vitamin D 50,000 IU 1 p.o. weekly.  Other hyperlipidemia -     Lipid Panel With LDL/HDL Ratio  Cardiovascular risk and specific lipid/LDL goals reviewed.  We discussed several lifestyle modifications today and Krystal Kim will continue to work on diet, exercise and weight loss efforts. Orders and follow up as documented in patient record.   Counseling Intensive lifestyle modifications are the first line treatment for this issue. Dietary changes: Increase soluble fiber. Decrease simple carbohydrates. Exercise changes: Moderate to vigorous-intensity aerobic activity 150 minutes per week if tolerated. Lipid-lowering medications: see documented in medical record.   History of anemia -     CBC with  Differential/Platelet  Medication management -     Comprehensive metabolic panel  Class 2 obesity due to excess calories without serious comorbidity with body mass index (BMI) of 36.0 to 36.9 in adult -     ZOXWRU; Inject 0.5 mg into the skin once a week.  Dispense: 2 mL; Refill: 0         Return in about 4 weeks (around 08/22/2023).Marland Kitchen She was informed of the importance of frequent follow up visits to maximize her success with intensive lifestyle modifications for her multiple health conditions.   ATTESTASTION STATEMENTS:  Reviewed by clinician on day of visit: allergies, medications, problem list, medical history, surgical history, family history, social history, and previous encounter notes.     [name]. Madelynne Lasker FNP-C

## 2023-07-26 LAB — COMPREHENSIVE METABOLIC PANEL
ALT: 17 [IU]/L (ref 0–32)
AST: 20 [IU]/L (ref 0–40)
Albumin: 4.4 g/dL (ref 3.9–4.9)
Alkaline Phosphatase: 97 [IU]/L (ref 44–121)
BUN/Creatinine Ratio: 14 (ref 9–23)
BUN: 10 mg/dL (ref 6–24)
Bilirubin Total: 0.5 mg/dL (ref 0.0–1.2)
CO2: 21 mmol/L (ref 20–29)
Calcium: 9.5 mg/dL (ref 8.7–10.2)
Chloride: 104 mmol/L (ref 96–106)
Creatinine, Ser: 0.73 mg/dL (ref 0.57–1.00)
Globulin, Total: 2.4 g/dL (ref 1.5–4.5)
Glucose: 78 mg/dL (ref 70–99)
Potassium: 4.9 mmol/L (ref 3.5–5.2)
Sodium: 139 mmol/L (ref 134–144)
Total Protein: 6.8 g/dL (ref 6.0–8.5)
eGFR: 102 mL/min/{1.73_m2} (ref >59)

## 2023-07-26 LAB — TSH: TSH: 3.28 u[IU]/mL (ref 0.450–4.500)

## 2023-07-26 LAB — LIPID PANEL WITH LDL/HDL RATIO
Cholesterol, Total: 175 mg/dL (ref 100–199)
HDL: 41 mg/dL (ref >39)
LDL Chol Calc (NIH): 112 mg/dL — ABNORMAL HIGH (ref 0–99)
LDL/HDL Ratio: 2.7 {ratio} (ref 0.0–3.2)
Triglycerides: 123 mg/dL (ref 0–149)
VLDL Cholesterol Cal: 22 mg/dL (ref 5–40)

## 2023-07-26 LAB — CBC WITH DIFFERENTIAL/PLATELET

## 2023-07-26 LAB — VITAMIN D 25 HYDROXY (VIT D DEFICIENCY, FRACTURES): Vit D, 25-Hydroxy: 55 ng/mL (ref 30.0–100.0)

## 2023-07-26 LAB — T4, FREE: Free T4: 1.28 ng/dL (ref 0.82–1.77)

## 2023-08-22 ENCOUNTER — Other Ambulatory Visit (HOSPITAL_COMMUNITY): Payer: Self-pay

## 2023-08-22 ENCOUNTER — Ambulatory Visit (INDEPENDENT_AMBULATORY_CARE_PROVIDER_SITE_OTHER): Payer: 59 | Admitting: Nurse Practitioner

## 2023-08-22 ENCOUNTER — Other Ambulatory Visit: Payer: Self-pay

## 2023-08-22 ENCOUNTER — Encounter: Payer: Self-pay | Admitting: Nurse Practitioner

## 2023-08-22 VITALS — BP 107/74 | HR 89 | Temp 97.7°F | Ht 63.0 in | Wt 202.0 lb

## 2023-08-22 DIAGNOSIS — Z6835 Body mass index (BMI) 35.0-35.9, adult: Secondary | ICD-10-CM

## 2023-08-22 DIAGNOSIS — E66812 Obesity, class 2: Secondary | ICD-10-CM

## 2023-08-22 DIAGNOSIS — E559 Vitamin D deficiency, unspecified: Secondary | ICD-10-CM | POA: Diagnosis not present

## 2023-08-22 DIAGNOSIS — E6609 Other obesity due to excess calories: Secondary | ICD-10-CM

## 2023-08-22 MED ORDER — WEGOVY 1 MG/0.5ML ~~LOC~~ SOAJ: 1.0000 mg | SUBCUTANEOUS | 0 refills | Status: DC

## 2023-08-22 MED ORDER — VITAMIN D (ERGOCALCIFEROL) 1.25 MG (50000 UNIT) PO CAPS
50000.0000 [IU] | ORAL_CAPSULE | ORAL | 0 refills | Status: DC
  Filled 2023-08-22: qty 5, 35d supply, fill #0

## 2023-08-22 NOTE — Progress Notes (Signed)
 Office: [phone]  /  Fax: [phone]  WEIGHT SUMMARY AND BIOMETRICS  Weight Lost Since Last Visit: 3lb  Weight Gained Since Last Visit: 0lb   Vitals Temp: 97.7 F (36.5 C) BP: 107/74 Pulse Rate: 89 SpO2: 99 %   Anthropometric Measurements Height: 5\' 3"  (1.6 m) Weight: 202 lb (91.6 kg) BMI (Calculated): 35.79 Weight at Last Visit: 205lb Weight Lost Since Last Visit: 3lb Weight Gained Since Last Visit: 0lb Starting Weight: 251lb Total Weight Loss (lbs): 49 lb (22.2 kg)   Body Composition  Body Fat %: 42.4 % Fat Mass (lbs): 85.6 lbs Muscle Mass (lbs): 110.6 lbs Total Body Water (lbs): 75 lbs Visceral Fat Rating : 11   Other Clinical Data Fasting: NO Labs: No Today's Visit #: 10 Starting Date: 12/12/22     HPI  Chief Complaint: OBESITY  Krystal Kim is here to discuss her progress with her obesity treatment plan. She is on the the Category 2 Plan and states she is following her eating plan approximately 80-90 % of the time. She states she is exercising 15 minutes 3 days per week.   Interval History:  Since last office visit she has lost 3 pounds.  She has been averaging over 100 grams of protein per day.  She has been meal prepping.  She is snacking on beef jerky, cottage cheese, tuna packs, etc.  She is drinking water, coffee with collagen daily and sometimes a protein shake.  Notes polyphagia and cravings especially over the past 2 months. Has a stand up desk at work.  Tries to stay as active as possible but is limited on time due to work schedule and until her daughter graduates.    Pharmacotherapy for weight loss: She is currently taking Wegovy 0.5mg  for medical weight loss.  Denies side effects.     Previous pharmacotherapy for medical weight loss:  none   Bariatric surgery:  Patient has not had bariatric surgery.  Vit D deficiency  She is taking Vit D 50,000 IU weekly.  Denies side effects.  Denies nausea, vomiting or muscle weakness.    Labs  improved  Lab Results  Component Value Date   VD25OH 55.0 07/25/2023   VD25OH 16.9 (L) 12/12/2022     PHYSICAL EXAM:  Blood pressure 107/74, pulse 89, temperature 97.7 F (36.5 C), height 5\' 3"  (1.6 m), weight 202 lb (91.6 kg), SpO2 99%. Body mass index is 35.78 kg/m.  General: She is overweight, cooperative, alert, well developed, and in no acute distress. PSYCH: Has normal mood, affect and thought process.   Extremities: No edema.  Neurologic: No gross sensory or motor deficits. No tremors or fasciculations noted.    DIAGNOSTIC DATA REVIEWED:  BMET    Component Value Date/Time   NA 139 07/25/2023 0823   K 4.9 07/25/2023 0823   CL 104 07/25/2023 0823   CO2 21 07/25/2023 0823   GLUCOSE 78 07/25/2023 0823   GLUCOSE 88 11/05/2015 0228   BUN 10 07/25/2023 0823   CREATININE 0.73 07/25/2023 0823   CALCIUM 9.5 07/25/2023 0823   GFRNONAA 112 07/24/2017 1138   GFRAA 130 07/24/2017 1138   Lab Results  Component Value Date   HGBA1C 5.4 12/12/2022   HGBA1C 5.4 01/20/2022   Lab Results  Component Value Date   INSULIN 18.8 12/12/2022   Lab Results  Component Value Date   TSH 3.280 07/25/2023   CBC    Component Value Date/Time   WBC CANCELED 07/25/2023 0823   WBC 7.6 11/05/2015 0228  RBC CANCELED 07/25/2023 0823   RBC 4.74 11/05/2015 0228   HGB CANCELED 07/25/2023 0823   HCT CANCELED 07/25/2023 0823   PLT CANCELED 07/25/2023 0823   MCV 91 01/01/2023 0755   MCH 30.5 01/01/2023 0755   MCH 23.6 (L) 11/05/2015 0228   MCHC 33.4 01/01/2023 0755   MCHC 30.1 11/05/2015 0228   RDW 12.8 01/01/2023 0755   Iron Studies No results found for: "IRON", "TIBC", "FERRITIN", "IRONPCTSAT" Lipid Panel     Component Value Date/Time   CHOL 175 07/25/2023 0823   TRIG 123 07/25/2023 0823   HDL 41 07/25/2023 0823   CHOLHDL 4.2 01/20/2022 0856   LDLCALC 112 (H) 07/25/2023 0823   Hepatic Function Panel     Component Value Date/Time   PROT 6.8 07/25/2023 0823   ALBUMIN 4.4  07/25/2023 0823   AST 20 07/25/2023 0823   ALT 17 07/25/2023 0823   ALKPHOS 97 07/25/2023 0823   BILITOT 0.5 07/25/2023 0823      Component Value Date/Time   TSH 3.280 07/25/2023 0823   Nutritional Lab Results  Component Value Date   VD25OH 55.0 07/25/2023   VD25OH 16.9 (L) 12/12/2022     ASSESSMENT AND PLAN  TREATMENT PLAN FOR OBESITY:  Recommended Dietary Goals  Krystal Kim is currently in the action stage of change. As such, her goal is to continue weight management plan. She has agreed to the Category 2 Plan.  Behavioral Intervention  We discussed the following Behavioral Modification Strategies today: increasing lean protein intake to established goals, decreasing simple carbohydrates , increasing vegetables, increasing water intake , work on tracking and journaling calories using tracking application, reading food labels , keeping healthy foods at home, continue to practice mindfulness when eating, planning for success, and continue to work on maintaining a reduced calorie state, getting the recommended amount of protein, incorporating whole foods, making healthy choices, staying well hydrated and practicing mindfulness when eating..  Additional resources provided today: NA  Recommended Physical Activity Goals  Krystal Kim has been advised to work up to 150 minutes of moderate intensity aerobic activity a week and strengthening exercises 2-3 times per week for cardiovascular health, weight loss maintenance and preservation of muscle mass.   She has agreed to Think about enjoyable ways to increase daily physical activity and overcoming barriers to exercise, Increase physical activity in their day and reduce sedentary time (increase NEAT)., Increase the intensity, frequency or duration of strengthening exercises , and Increase the intensity, frequency or duration of aerobic exercises     Pharmacotherapy We discussed various medication options to help Krystal Kim with her weight loss  efforts and we both agreed to increase Wegovy 1mg .  Side effects discussed.  ASSOCIATED CONDITIONS ADDRESSED TODAY  Action/Plan  Vitamin D deficiency -     Vitamin D (Ergocalciferol); Take 1 capsule (50,000 Units total) by mouth every 7 (seven) days.  Dispense: 5 capsule; Refill: 0  Class 2 obesity due to excess calories without serious comorbidity with body mass index (BMI) of 35.0 to 35.9 in adult -     GNFAOZ; Inject 1 mg into the skin once a week. [phone]  Dispense: 2 mL; Refill: 0      Labs reviewed in chart with patient from 07/25/23   No follow-ups on file.Marland Kitchen She was informed of the importance of frequent follow up visits to maximize her success with intensive lifestyle modifications for her multiple health conditions.   ATTESTASTION STATEMENTS:  Reviewed by clinician on day of visit: allergies, medications, problem list,  medical history, surgical history, family history, social history, and previous encounter notes.    Krystal Sato. Amadu Schlageter FNP-C

## 2023-09-19 ENCOUNTER — Other Ambulatory Visit (HOSPITAL_COMMUNITY): Payer: Self-pay

## 2023-09-19 ENCOUNTER — Encounter: Payer: Self-pay | Admitting: Bariatrics

## 2023-09-19 ENCOUNTER — Other Ambulatory Visit: Payer: Self-pay

## 2023-09-19 ENCOUNTER — Ambulatory Visit: Payer: 59 | Admitting: Bariatrics

## 2023-09-19 VITALS — BP 105/67 | HR 68 | Temp 97.9°F | Ht 63.0 in | Wt 197.0 lb

## 2023-09-19 DIAGNOSIS — E559 Vitamin D deficiency, unspecified: Secondary | ICD-10-CM | POA: Diagnosis not present

## 2023-09-19 DIAGNOSIS — Z6834 Body mass index (BMI) 34.0-34.9, adult: Secondary | ICD-10-CM | POA: Diagnosis not present

## 2023-09-19 DIAGNOSIS — E669 Obesity, unspecified: Secondary | ICD-10-CM | POA: Diagnosis not present

## 2023-09-19 DIAGNOSIS — R632 Polyphagia: Secondary | ICD-10-CM | POA: Diagnosis not present

## 2023-09-19 DIAGNOSIS — E66812 Obesity, class 2: Secondary | ICD-10-CM

## 2023-09-19 MED ORDER — VITAMIN D (ERGOCALCIFEROL) 1.25 MG (50000 UNIT) PO CAPS
50000.0000 [IU] | ORAL_CAPSULE | ORAL | 0 refills | Status: DC
  Filled 2023-09-19: qty 5, 35d supply, fill #0

## 2023-09-19 MED ORDER — WEGOVY 1 MG/0.5ML ~~LOC~~ SOAJ: 1.0000 mg | SUBCUTANEOUS | 0 refills | Status: DC

## 2023-09-19 NOTE — Progress Notes (Signed)
 WEIGHT SUMMARY AND BIOMETRICS  Weight Lost Since Last Visit: 5lb  Weight Gained Since Last Visit: 0   Vitals Temp: 97.9 F (36.6 C) BP: 105/67 Pulse Rate: 68 SpO2: 100 %   Anthropometric Measurements Height: 5\' 3"  (1.6 m) Weight: 197 lb (89.4 kg) BMI (Calculated): 34.91 Weight at Last Visit: 202lb Weight Lost Since Last Visit: 5lb Weight Gained Since Last Visit: 0 Starting Weight: 251lb Total Weight Loss (lbs): 54 lb (24.5 kg)   Body Composition  Body Fat %: 41.5 % Fat Mass (lbs): 82 lbs Muscle Mass (lbs): 109.6 lbs Total Body Water (lbs): 74.2 lbs Visceral Fat Rating : 10   Other Clinical Data Fasting: no Labs: no Today's Visit #: 11 Starting Date: 12/12/22    OBESITY Krystal Kim is here to discuss her progress with her obesity treatment plan along with follow-up of her obesity related diagnoses.    Nutrition Plan: the Category 2 plan - 85% adherence.  Current exercise: weightlifting  Interim History:  She is down 5 lbs since her last visit.  Eating all of the food on the plan., Protein intake is as prescribed, Is not skipping meals, and Water intake is adequate.   Pharmacotherapy: Krystal Kim is on Wegovy 1.0 mg SQ weekly Adverse side effects: Nausea , mild  Hunger is moderately controlled.  Cravings are moderately controlled.  Assessment/Plan:   Polyphagia Krystal Kim endorses excessive hunger.  Medication(s): ZOXWRU Effects of medication:  moderately controlled. Cravings are moderately controlled.   Plan: Medication(s): Wegovy 1.0 mg SQ weekly Will increase water, protein and fiber to help assuage hunger.  Will keep protein greater than 100 grams.  Will minimize foods that have a high glucose index/load to minimize reactive hypoglycemia.  Will continue to cook at home. Will make good snacking choices.   Vitamin D Deficiency Vitamin D is at  goal of 50.  Most recent vitamin D level was 55.0. She is on  prescription ergocalciferol 50,000 IU weekly. Lab Results  Component Value Date   VD25OH 55.0 07/25/2023   VD25OH 16.9 (L) 12/12/2022    Plan: Refill prescription vitamin D 50,000 IU weekly.    Generalized Obesity: Current BMI BMI (Calculated): 34.91   Pharmacotherapy Plan Continue and refill  Wegovy 1.0 mg SQ weekly  Krystal Kim is currently in the action stage of change. As such, her goal is to continue with weight loss efforts.  She has agreed to the Category 2 plan.  Exercise goals: All adults should avoid inactivity. Some physical activity is better than none, and adults who participate in any amount of physical activity gain some health benefits.  Behavioral modification strategies: increasing lean protein intake, no meal skipping, meal planning , planning for success, and avoiding temptations.  Krystal Kim has agreed to follow-up with our clinic in 4 weeks.    Objective:   VITALS: Per patient if applicable, see vitals. GENERAL: Alert and  in no acute distress. CARDIOPULMONARY: No increased WOB. Speaking in clear sentences.  PSYCH: Pleasant and cooperative. Speech normal rate and rhythm. Affect is appropriate. Insight and judgement are appropriate. Attention is focused, linear, and appropriate.  NEURO: Oriented as arrived to appointment on time with no prompting.   Attestation Statements:   This was prepared with the assistance of Engineer, civil (consulting).  Occasional wrong-word or sound-a-like substitutions may have occurred due to the inherent limitations of voice recognition   Krystal Capra, DO

## 2023-09-24 ENCOUNTER — Ambulatory Visit: Payer: 59 | Admitting: Bariatrics

## 2023-10-17 ENCOUNTER — Ambulatory Visit: Admitting: Nurse Practitioner

## 2023-10-17 ENCOUNTER — Other Ambulatory Visit (HOSPITAL_COMMUNITY): Payer: Self-pay

## 2023-10-17 ENCOUNTER — Encounter: Payer: Self-pay | Admitting: Nurse Practitioner

## 2023-10-17 ENCOUNTER — Other Ambulatory Visit: Payer: Self-pay

## 2023-10-17 VITALS — BP 101/70 | HR 65 | Temp 97.8°F | Ht 63.0 in | Wt 191.0 lb

## 2023-10-17 DIAGNOSIS — E66811 Obesity, class 1: Secondary | ICD-10-CM | POA: Diagnosis not present

## 2023-10-17 DIAGNOSIS — Z6833 Body mass index (BMI) 33.0-33.9, adult: Secondary | ICD-10-CM

## 2023-10-17 DIAGNOSIS — E559 Vitamin D deficiency, unspecified: Secondary | ICD-10-CM | POA: Diagnosis not present

## 2023-10-17 DIAGNOSIS — E6609 Other obesity due to excess calories: Secondary | ICD-10-CM

## 2023-10-17 MED ORDER — WEGOVY 1 MG/0.5ML ~~LOC~~ SOAJ
1.0000 mg | SUBCUTANEOUS | 0 refills | Status: DC
  Filled 2023-10-17: qty 2, 28d supply, fill #0

## 2023-10-17 MED ORDER — VITAMIN D (ERGOCALCIFEROL) 1.25 MG (50000 UNIT) PO CAPS
50000.0000 [IU] | ORAL_CAPSULE | ORAL | 0 refills | Status: DC
  Filled 2023-10-17: qty 5, 35d supply, fill #0

## 2023-10-17 NOTE — Progress Notes (Signed)
 Office: [phone]  /  Fax: [phone]  WEIGHT SUMMARY AND BIOMETRICS  Weight Lost Since Last Visit: 6lb  Weight Gained Since Last Visit: 0lb   Vitals Temp: 97.8 F (36.6 C) BP: 101/70 Pulse Rate: 65 SpO2: 98 %   Anthropometric Measurements Height: 5\' 3"  (1.6 m) Weight: 191 lb (86.6 kg) BMI (Calculated): 33.84 Weight at Last Visit: 197lb Weight Lost Since Last Visit: 6lb Weight Gained Since Last Visit: 0lb Starting Weight: 251lb Total Weight Loss (lbs): 60 lb (27.2 kg)   Body Composition  Body Fat %: 40.2 % Fat Mass (lbs): 77 lbs Muscle Mass (lbs): 108.6 lbs Total Body Water (lbs): 73 lbs Visceral Fat Rating : 10   Other Clinical Data Fasting: No Labs: No Today's Visit #: 12 Starting Date: 12/12/22     HPI  Chief Complaint: OBESITY  Marvelous is here to discuss her progress with her obesity treatment plan. She is on the the Category 2 Plan and states she is following her eating plan approximately 50 % of the time. She states she is exercising 30 minutes 2 days per week.   Interval History:  Since last office visit she has lost 6 pounds.  She went to [facility] and to her daughter's college graduation since her last visit. She is not skipping meals and is eating a protein with every meal.  She is snacking on protein-beef jerky, protein powder. She is drinking coffee, water, protein shakes, seltzer water and diet soda.    Pharmacotherapy for weight loss: She is currently taking Wegovy  1 mg for medical weight loss.  Denies side effects. Has helped with polyphagia and cravings.      Previous pharmacotherapy for medical weight loss:  none   Bariatric surgery:  Patient has not had bariatric surgery.   Vit D deficiency  She is taking Vit D 50,000 IU weekly.  Denies side effects.  Denies nausea, vomiting or muscle weakness.    Lab Results  Component Value Date   VD25OH 55.0 07/25/2023   VD25OH 16.9 (L) 12/12/2022      PHYSICAL EXAM:  Blood pressure  101/70, pulse 65, temperature 97.8 F (36.6 C), height 5\' 3"  (1.6 m), weight 191 lb (86.6 kg), SpO2 98%. Body mass index is 33.83 kg/m.  General: She is overweight, cooperative, alert, well developed, and in no acute distress. PSYCH: Has normal mood, affect and thought process.   Extremities: No edema.  Neurologic: No gross sensory or motor deficits. No tremors or fasciculations noted.    DIAGNOSTIC DATA REVIEWED:  BMET    Component Value Date/Time   NA 139 07/25/2023 0823   K 4.9 07/25/2023 0823   CL 104 07/25/2023 0823   CO2 21 07/25/2023 0823   GLUCOSE 78 07/25/2023 0823   GLUCOSE 88 11/05/2015 0228   BUN 10 07/25/2023 0823   CREATININE 0.73 07/25/2023 0823   CALCIUM 9.5 07/25/2023 0823   GFRNONAA 112 07/24/2017 1138   GFRAA 130 07/24/2017 1138   Lab Results  Component Value Date   HGBA1C 5.4 12/12/2022   HGBA1C 5.4 01/20/2022   Lab Results  Component Value Date   INSULIN  18.8 12/12/2022   Lab Results  Component Value Date   TSH 3.280 07/25/2023   CBC    Component Value Date/Time   WBC CANCELED 07/25/2023 0823   WBC 7.6 11/05/2015 0228   RBC CANCELED 07/25/2023 0823   RBC 4.74 11/05/2015 0228   HGB CANCELED 07/25/2023 0823   HCT CANCELED 07/25/2023 0823   PLT CANCELED  07/25/2023 0823   MCV 91 01/01/2023 0755   MCH 30.5 01/01/2023 0755   MCH 23.6 (L) 11/05/2015 0228   MCHC 33.4 01/01/2023 0755   MCHC 30.1 11/05/2015 0228   RDW 12.8 01/01/2023 0755   Iron Studies No results found for: "IRON", "TIBC", "FERRITIN", "IRONPCTSAT" Lipid Panel     Component Value Date/Time   CHOL 175 07/25/2023 0823   TRIG 123 07/25/2023 0823   HDL 41 07/25/2023 0823   CHOLHDL 4.2 01/20/2022 0856   LDLCALC 112 (H) 07/25/2023 0823   Hepatic Function Panel     Component Value Date/Time   PROT 6.8 07/25/2023 0823   ALBUMIN 4.4 07/25/2023 0823   AST 20 07/25/2023 0823   ALT 17 07/25/2023 0823   ALKPHOS 97 07/25/2023 0823   BILITOT 0.5 07/25/2023 0823      Component  Value Date/Time   TSH 3.280 07/25/2023 0823   Nutritional Lab Results  Component Value Date   VD25OH 55.0 07/25/2023   VD25OH 16.9 (L) 12/12/2022     ASSESSMENT AND PLAN  TREATMENT PLAN FOR OBESITY:  Recommended Dietary Goals  Caiden is currently in the action stage of change. As such, her goal is to continue weight management plan. She has agreed to the Category 2 Plan.  Behavioral Intervention  We discussed the following Behavioral Modification Strategies today: increasing lean protein intake to established goals, decreasing simple carbohydrates , increasing vegetables, increasing fiber rich foods, increasing water intake , work on meal planning and preparation, reading food labels , keeping healthy foods at home, and continue to work on maintaining a reduced calorie state, getting the recommended amount of protein, incorporating whole foods, making healthy choices, staying well hydrated and practicing mindfulness when eating..  Additional resources provided today: NA  Recommended Physical Activity Goals  Zaryia has been advised to work up to 150 minutes of moderate intensity aerobic activity a week and strengthening exercises 2-3 times per week for cardiovascular health, weight loss maintenance and preservation of muscle mass.   She has agreed to Think about enjoyable ways to increase daily physical activity and overcoming barriers to exercise, Increase physical activity in their day and reduce sedentary time (increase NEAT)., Start strengthening exercises with a goal of 2-3 sessions a week , and continue to gradually increase the amount and intensity of exercise routine   Pharmacotherapy We discussed various medication options to help Aivah with her weight loss efforts and we both agreed to continue Wegovy  1mg .  Side effects discussed. .  ASSOCIATED CONDITIONS ADDRESSED TODAY  Action/Plan  Vitamin D  deficiency -     Vitamin D  (Ergocalciferol ); Take 1 capsule (50,000 Units  total) by mouth every 7 (seven) days.  Dispense: 5 capsule; Refill: 0  Class 1 obesity due to excess calories without serious comorbidity with body mass index (BMI) of 33.0 to 33.9 in adult -     Wegovy ; Inject 1 mg into the skin once a week. [phone]  Dispense: 2 mL; Refill: 0         Return in about 4 weeks (around 11/14/2023).Aaron Aas She was informed of the importance of frequent follow up visits to maximize her success with intensive lifestyle modifications for her multiple health conditions.   ATTESTASTION STATEMENTS:  Reviewed by clinician on day of visit: allergies, medications, problem list, medical history, surgical history, family history, social history, and previous encounter notes.     Crist Dominion. Takeira Yanes FNP-C

## 2023-11-14 ENCOUNTER — Ambulatory Visit: Admitting: Nurse Practitioner

## 2023-11-15 ENCOUNTER — Ambulatory Visit (INDEPENDENT_AMBULATORY_CARE_PROVIDER_SITE_OTHER): Admitting: Nurse Practitioner

## 2023-11-15 ENCOUNTER — Other Ambulatory Visit: Payer: Self-pay

## 2023-11-15 ENCOUNTER — Encounter: Payer: Self-pay | Admitting: Nurse Practitioner

## 2023-11-15 VITALS — BP 113/79 | HR 69 | Temp 97.4°F | Ht 63.0 in | Wt 185.0 lb

## 2023-11-15 DIAGNOSIS — E66811 Obesity, class 1: Secondary | ICD-10-CM | POA: Diagnosis not present

## 2023-11-15 DIAGNOSIS — Z6832 Body mass index (BMI) 32.0-32.9, adult: Secondary | ICD-10-CM | POA: Diagnosis not present

## 2023-11-15 DIAGNOSIS — E559 Vitamin D deficiency, unspecified: Secondary | ICD-10-CM

## 2023-11-15 MED ORDER — WEGOVY 1 MG/0.5ML ~~LOC~~ SOAJ
1.0000 mg | SUBCUTANEOUS | 0 refills | Status: DC
  Filled 2023-11-15: qty 2, 28d supply, fill #0

## 2023-11-15 MED ORDER — VITAMIN D (ERGOCALCIFEROL) 1.25 MG (50000 UNIT) PO CAPS
50000.0000 [IU] | ORAL_CAPSULE | ORAL | 0 refills | Status: DC
  Filled 2023-11-15: qty 5, 35d supply, fill #0

## 2023-11-15 NOTE — Progress Notes (Signed)
 Office: [phone]  /  Fax: [phone]  WEIGHT SUMMARY AND BIOMETRICS  Weight Lost Since Last Visit: 6lb  Weight Gained Since Last Visit: 0   Vitals Temp: (!) 97.4 F (36.3 C) BP: 113/79 Pulse Rate: 69 SpO2: 100 %   Anthropometric Measurements Height: 5\' 3"  (1.6 m) Weight: 185 lb (83.9 kg) BMI (Calculated): 32.78 Weight at Last Visit: 191lb Weight Lost Since Last Visit: 6lb Weight Gained Since Last Visit: 0 Starting Weight: 251lb Total Weight Loss (lbs): 66 lb (29.9 kg)   Body Composition  Body Fat %: 36.1 % Fat Mass (lbs): 67 lbs Muscle Mass (lbs): 112.6 lbs Total Body Water (lbs): 73 lbs Visceral Fat Rating : 8   Other Clinical Data Fasting: no Labs: no Today's Visit #: 13 Starting Date: 12/12/22     HPI  Chief Complaint: OBESITY  Krystal Kim is here to discuss her progress with her obesity treatment plan. She is on the the Category 2 Plan and states she is following her eating plan approximately 75 % of the time. She states she is walking a total of 8 miles per week.   Interval History:  Since last office visit she has lost 6 pounds.  She has been traveling weekly since her last visit.  She has been trying to make healthier choices.  She has done "mild cheating".  She has overall done well with weight loss.  Planning to [place] the next [place].    Pharmacotherapy for weight loss: She is currently taking Wegovy  1 mg for medical weight loss.  Denies side effects. Has helped with polyphagia and cravings.      Previous pharmacotherapy for medical weight loss:  none   Bariatric surgery:  Patient has not had bariatric surgery.   Vit D deficiency  She is taking Vit D 50,000 IU weekly.  Denies side effects.  Denies nausea, vomiting or muscle weakness.    Lab Results  Component Value Date   VD25OH 55.0 07/25/2023   VD25OH 16.9 (L) 12/12/2022     PHYSICAL EXAM:  Blood pressure 113/79, pulse 69, temperature (!) 97.4 F (36.3 C),  height 5\' 3"  (1.6 m), weight 185 lb (83.9 kg), SpO2 100%. Body mass index is 32.77 kg/m.  General: She is overweight, cooperative, alert, well developed, and in no acute distress. PSYCH: Has normal mood, affect and thought process.   Extremities: No edema.  Neurologic: No gross sensory or motor deficits. No tremors or fasciculations noted.    DIAGNOSTIC DATA REVIEWED:  BMET    Component Value Date/Time   NA 139 07/25/2023 0823   K 4.9 07/25/2023 0823   CL 104 07/25/2023 0823   CO2 21 07/25/2023 0823   GLUCOSE 78 07/25/2023 0823   GLUCOSE 88 11/05/2015 0228   BUN 10 07/25/2023 0823   CREATININE 0.73 07/25/2023 0823   CALCIUM 9.5 07/25/2023 0823   GFRNONAA 112 07/24/2017 1138   GFRAA 130 07/24/2017 1138   Lab Results  Component Value Date   HGBA1C 5.4 12/12/2022   HGBA1C 5.4 01/20/2022   Lab Results  Component Value Date   INSULIN  18.8 12/12/2022   Lab Results  Component Value Date   TSH 3.280 07/25/2023   CBC    Component Value Date/Time   WBC CANCELED 07/25/2023 0823   WBC 7.6 11/05/2015 0228   RBC CANCELED 07/25/2023 0823   RBC 4.74 11/05/2015 0228   HGB CANCELED 07/25/2023 0823   HCT CANCELED 07/25/2023 0823   PLT CANCELED 07/25/2023 2956  MCV 91 01/01/2023 0755   MCH 30.5 01/01/2023 0755   MCH 23.6 (L) 11/05/2015 0228   MCHC 33.4 01/01/2023 0755   MCHC 30.1 11/05/2015 0228   RDW 12.8 01/01/2023 0755   Iron Studies No results found for: "IRON", "TIBC", "FERRITIN", "IRONPCTSAT" Lipid Panel     Component Value Date/Time   CHOL 175 07/25/2023 0823   TRIG 123 07/25/2023 0823   HDL 41 07/25/2023 0823   CHOLHDL 4.2 01/20/2022 0856   LDLCALC 112 (H) 07/25/2023 0823   Hepatic Function Panel     Component Value Date/Time   PROT 6.8 07/25/2023 0823   ALBUMIN 4.4 07/25/2023 0823   AST 20 07/25/2023 0823   ALT 17 07/25/2023 0823   ALKPHOS 97 07/25/2023 0823   BILITOT 0.5 07/25/2023 0823      Component Value Date/Time   TSH 3.280 07/25/2023 0823    Nutritional Lab Results  Component Value Date   VD25OH 55.0 07/25/2023   VD25OH 16.9 (L) 12/12/2022     ASSESSMENT AND PLAN  TREATMENT PLAN FOR OBESITY:  Recommended Dietary Goals  Krystal Kim is currently in the action stage of change. As such, her goal is to continue weight management plan. She has agreed to the Category 2 Plan.  Behavioral Intervention  We discussed the following Behavioral Modification Strategies today: increasing lean protein intake to established goals, decreasing simple carbohydrates , increasing vegetables, increasing fiber rich foods, increasing water intake , continue to work on implementation of reduced calorie nutritional plan, continue to practice mindfulness when eating, and continue to work on maintaining a reduced calorie state, getting the recommended amount of protein, incorporating whole foods, making healthy choices, staying well hydrated and practicing mindfulness when eating..  Additional resources provided today: NA  Recommended Physical Activity Goals  Krystal Kim has been advised to work up to 150 minutes of moderate intensity aerobic activity a week and strengthening exercises 2-3 times per week for cardiovascular health, weight loss maintenance and preservation of muscle mass.   She has agreed to Think about enjoyable ways to increase daily physical activity and overcoming barriers to exercise, Increase physical activity in their day and reduce sedentary time (increase NEAT)., and continue to gradually increase the amount and intensity of exercise routine   Pharmacotherapy We discussed various medication options to help Krystal Kim with her weight loss efforts and we both agreed to continue Wegovy  1mg . Side effects discussed.  ASSOCIATED CONDITIONS ADDRESSED TODAY  Action/Plan  Vitamin D  deficiency -     Vitamin D  (Ergocalciferol ); Take 1 capsule (50,000 Units total) by mouth every 7 (seven) days.  Dispense: 5 capsule; Refill: 0  Class 1  drug-induced obesity without serious comorbidity with body mass index (BMI) of 32.0 to 32.9 in adult -     Wegovy ; Inject 1 mg into the skin once a week. [phone]  Dispense: 2 mL; Refill: 0         Return in about 4 weeks (around 12/13/2023).Aaron Aas She was informed of the importance of frequent follow up visits to maximize her success with intensive lifestyle modifications for her multiple health conditions.   ATTESTASTION STATEMENTS:  Reviewed by clinician on day of visit: allergies, medications, problem list, medical history, surgical history, family history, social history, and previous encounter notes.     Crist Dominion. Gisselle Galvis FNP-C

## 2023-11-16 ENCOUNTER — Other Ambulatory Visit: Payer: Self-pay

## 2023-11-16 ENCOUNTER — Other Ambulatory Visit (HOSPITAL_COMMUNITY): Payer: Self-pay

## 2023-12-03 ENCOUNTER — Other Ambulatory Visit (HOSPITAL_COMMUNITY): Payer: Self-pay

## 2023-12-12 ENCOUNTER — Ambulatory Visit: Admitting: Nurse Practitioner

## 2023-12-12 ENCOUNTER — Other Ambulatory Visit (HOSPITAL_COMMUNITY): Payer: Self-pay

## 2023-12-12 ENCOUNTER — Other Ambulatory Visit: Payer: Self-pay

## 2023-12-12 VITALS — BP 112/74 | HR 70 | Temp 97.9°F | Ht 63.0 in | Wt 181.0 lb

## 2023-12-12 DIAGNOSIS — E661 Drug-induced obesity: Secondary | ICD-10-CM

## 2023-12-12 DIAGNOSIS — E063 Autoimmune thyroiditis: Secondary | ICD-10-CM | POA: Diagnosis not present

## 2023-12-12 DIAGNOSIS — Z6832 Body mass index (BMI) 32.0-32.9, adult: Secondary | ICD-10-CM | POA: Diagnosis not present

## 2023-12-12 DIAGNOSIS — E66811 Obesity, class 1: Secondary | ICD-10-CM

## 2023-12-12 MED ORDER — WEGOVY 1 MG/0.5ML ~~LOC~~ SOAJ
1.0000 mg | SUBCUTANEOUS | 0 refills | Status: DC
Start: 2023-12-12 — End: 2024-01-10
  Filled 2023-12-12: qty 2, 28d supply, fill #0

## 2023-12-12 NOTE — Progress Notes (Signed)
 Office: [phone]  /  Fax: [phone]  WEIGHT SUMMARY AND BIOMETRICS  Weight Lost Since Last Visit: 4lb  Weight Gained Since Last Visit: 0lb   Vitals Temp: 97.9 F (36.6 C) BP: 112/74 Pulse Rate: 70 SpO2: 98 %   Anthropometric Measurements Height: 5' 3 (1.6 m) Weight: 181 lb (82.1 kg) BMI (Calculated): 32.07 Weight at Last Visit: 185lb Weight Lost Since Last Visit: 4lb Weight Gained Since Last Visit: 0lb Starting Weight: 251lb Total Weight Loss (lbs): 70 lb (31.8 kg)   Body Composition  Body Fat %: 36.6 % Fat Mass (lbs): 66.4 lbs Muscle Mass (lbs): 109.2 lbs Total Body Water (lbs): 71.6 lbs Visceral Fat Rating : 9   Other Clinical Data Fasting: No Labs: No Today's Visit #: 14 Starting Date: 12/12/22     HPI  Chief Complaint: OBESITY  Krystal Kim is here to discuss her progress with her obesity treatment plan. She is on the the Category 2 Plan and states she is following her eating plan approximately 75 % of the time. She states she is exercising 20-30 minutes 5 days per week.   Interval History:  Since last office visit she has lost 4 pounds.  She went to the beach last week.  She has overall done well with weight loss. She is drinking water with collagen powder, diet soda and coffee with protein shake.  She is exercising 5 days per week-walking and doing resistance training. Doing well concerning with polyphagia and cravings.   Her highest weight was 257 lbs  Her nadir weight was 175 mg > 20 years ago.      Pharmacotherapy for weight loss: She is currently taking Wegovy  1 mg for medical weight loss.  Denies side effects. Has helped with polyphagia and cravings.      Previous pharmacotherapy for medical weight loss:  None   Bariatric surgery:  Patient has not had bariatric surgery.    Hypothyroidism Stable.  Does not report symptoms associated with uncontrolled hypothyroidism.  She has a follow up appt with endo on 02/29/24. Medication(s):  Levothyroxine  75 mcg daily. Lab Results  Component Value Date   TSH 3.280 07/25/2023     PHYSICAL EXAM:  Blood pressure 112/74, pulse 70, temperature 97.9 F (36.6 C), height 5' 3 (1.6 m), weight 181 lb (82.1 kg), SpO2 98%. Body mass index is 32.06 kg/m.  General: She is overweight, cooperative, alert, well developed, and in no acute distress. PSYCH: Has normal mood, affect and thought process.   Extremities: No edema.  Neurologic: No gross sensory or motor deficits. No tremors or fasciculations noted.    DIAGNOSTIC DATA REVIEWED:  BMET    Component Value Date/Time   NA 139 07/25/2023 0823   K 4.9 07/25/2023 0823   CL 104 07/25/2023 0823   CO2 21 07/25/2023 0823   GLUCOSE 78 07/25/2023 0823   GLUCOSE 88 11/05/2015 0228   BUN 10 07/25/2023 0823   CREATININE 0.73 07/25/2023 0823   CALCIUM 9.5 07/25/2023 0823   GFRNONAA 112 07/24/2017 1138   GFRAA 130 07/24/2017 1138   Lab Results  Component Value Date   HGBA1C 5.4 12/12/2022   HGBA1C 5.4 01/20/2022   Lab Results  Component Value Date   INSULIN  18.8 12/12/2022   Lab Results  Component Value Date   TSH 3.280 07/25/2023   CBC    Component Value Date/Time   WBC CANCELED 07/25/2023 0823   WBC 7.6 11/05/2015 0228   RBC CANCELED 07/25/2023 0823   RBC 4.74 11/05/2015  0228   HGB CANCELED 07/25/2023 0823   HCT CANCELED 07/25/2023 0823   PLT CANCELED 07/25/2023 0823   MCV 91 01/01/2023 0755   MCH 30.5 01/01/2023 0755   MCH 23.6 (L) 11/05/2015 0228   MCHC 33.4 01/01/2023 0755   MCHC 30.1 11/05/2015 0228   RDW 12.8 01/01/2023 0755   Iron Studies No results found for: IRON, TIBC, FERRITIN, IRONPCTSAT Lipid Panel     Component Value Date/Time   CHOL 175 07/25/2023 0823   TRIG 123 07/25/2023 0823   HDL 41 07/25/2023 0823   CHOLHDL 4.2 01/20/2022 0856   LDLCALC 112 (H) 07/25/2023 0823   Hepatic Function Panel     Component Value Date/Time   PROT 6.8 07/25/2023 0823   ALBUMIN 4.4 07/25/2023 0823    AST 20 07/25/2023 0823   ALT 17 07/25/2023 0823   ALKPHOS 97 07/25/2023 0823   BILITOT 0.5 07/25/2023 0823      Component Value Date/Time   TSH 3.280 07/25/2023 0823   Nutritional Lab Results  Component Value Date   VD25OH 55.0 07/25/2023   VD25OH 16.9 (L) 12/12/2022     ASSESSMENT AND PLAN  TREATMENT PLAN FOR OBESITY:  Recommended Dietary Goals  Krystal Kim is currently in the action stage of change. As such, her goal is to continue weight management plan. She has agreed to the Category 2 Plan.  Behavioral Intervention  We discussed the following Behavioral Modification Strategies today: increasing lean protein intake to established goals, decreasing simple carbohydrates , increasing vegetables, increasing fiber rich foods, increasing water intake , and continue to work on maintaining a reduced calorie state, getting the recommended amount of protein, incorporating whole foods, making healthy choices, staying well hydrated and practicing mindfulness when eating..  Additional resources provided today: NA  Recommended Physical Activity Goals  Krystal Kim has been advised to work up to 150 minutes of moderate intensity aerobic activity a week and strengthening exercises 2-3 times per week for cardiovascular health, weight loss maintenance and preservation of muscle mass.   She has agreed to Think about enjoyable ways to increase daily physical activity and overcoming barriers to exercise, Increase physical activity in their day and reduce sedentary time (increase NEAT)., Start strengthening exercises with a goal of 2-3 sessions a week , and continue to gradually increase the amount and intensity of exercise routine   Pharmacotherapy We discussed various medication options to help Krystal Kim with her weight loss efforts and we both agreed to continue Wegovy  1mg . Side effects discussed.  ASSOCIATED CONDITIONS ADDRESSED TODAY  Action/Plan  Hypothyroidism due to Hashimoto's  thyroiditis Continue to follow-up with endocrinology.  Keep follow-up appointment.  Continue medications as directed.  Class 1 drug-induced obesity without serious comorbidity with body mass index (BMI) of 32.0 to 32.9 in adult -     Wegovy ; Inject 1 mg into the skin once a week. [phone]  Dispense: 2 mL; Refill: 0         Return in about 4 weeks (around 01/09/2024).SABRA She was informed of the importance of frequent follow up visits to maximize her success with intensive lifestyle modifications for her multiple health conditions.   ATTESTASTION STATEMENTS:  Reviewed by clinician on day of visit: allergies, medications, problem list, medical history, surgical history, family history, social history, and previous encounter notes.     Corean SAUNDERS. Jacey Eckerson FNP-C

## 2024-01-09 ENCOUNTER — Ambulatory Visit: Admitting: Nurse Practitioner

## 2024-01-10 ENCOUNTER — Ambulatory Visit (INDEPENDENT_AMBULATORY_CARE_PROVIDER_SITE_OTHER): Admitting: Nurse Practitioner

## 2024-01-10 ENCOUNTER — Other Ambulatory Visit (HOSPITAL_COMMUNITY): Payer: Self-pay

## 2024-01-10 ENCOUNTER — Encounter: Payer: Self-pay | Admitting: Nurse Practitioner

## 2024-01-10 ENCOUNTER — Other Ambulatory Visit: Payer: Self-pay

## 2024-01-10 VITALS — BP 101/68 | HR 69 | Temp 98.1°F | Ht 63.0 in | Wt 177.0 lb

## 2024-01-10 DIAGNOSIS — Z6832 Body mass index (BMI) 32.0-32.9, adult: Secondary | ICD-10-CM | POA: Diagnosis not present

## 2024-01-10 DIAGNOSIS — E661 Drug-induced obesity: Secondary | ICD-10-CM

## 2024-01-10 DIAGNOSIS — L659 Nonscarring hair loss, unspecified: Secondary | ICD-10-CM | POA: Diagnosis not present

## 2024-01-10 DIAGNOSIS — E66811 Obesity, class 1: Secondary | ICD-10-CM | POA: Diagnosis not present

## 2024-01-10 DIAGNOSIS — E559 Vitamin D deficiency, unspecified: Secondary | ICD-10-CM

## 2024-01-10 MED ORDER — VITAMIN D (ERGOCALCIFEROL) 1.25 MG (50000 UNIT) PO CAPS
50000.0000 [IU] | ORAL_CAPSULE | ORAL | 0 refills | Status: AC
  Filled 2024-01-10: qty 5, 35d supply, fill #0

## 2024-01-10 MED ORDER — WEGOVY 1 MG/0.5ML ~~LOC~~ SOAJ
1.0000 mg | SUBCUTANEOUS | 0 refills | Status: DC
  Filled 2024-01-10: qty 2, 28d supply, fill #0

## 2024-01-10 NOTE — Progress Notes (Signed)
 Office: [phone]  /  Fax: [phone]  WEIGHT SUMMARY AND BIOMETRICS  Weight Lost Since Last Visit: 4 lb  Weight Gained Since Last Visit: 0   Vitals Temp: 98.1 F (36.7 C) BP: 101/68 Pulse Rate: 69 SpO2: 100 %   Anthropometric Measurements Height: 5' 3 (1.6 m) Weight: 177 lb (80.3 kg) BMI (Calculated): 31.36 Weight at Last Visit: 181 lb Weight Lost Since Last Visit: 4 lb Weight Gained Since Last Visit: 0 Starting Weight: 251 lb Total Weight Loss (lbs): 74 lb (33.6 kg) Peak Weight: 251 lb   Body Composition  Body Fat %: 39.9 % Fat Mass (lbs): 70.8 lbs Muscle Mass (lbs): 101.2 lbs Total Body Water (lbs): 72.4 lbs Visceral Fat Rating : 9   Other Clinical Data Fasting: no Labs: no Today's Visit #: 15 Starting Date: 12/12/22     HPI  Chief Complaint: OBESITY  Margaretha is here to discuss her progress with her obesity treatment plan. She is on the the Category 2 Plan and states she is following her eating plan approximately 85 % of the time. She states she is exercising walking, weights for 30 minutes 3 days per week.   Interval History:  Since last office visit she has lost 4 pounds.  She has overall done well with weight loss.  She has been struggling more with sweets cravings over the past month.  She is drinking water with collagen powder and crystal light, diet soda and coffee with protein shake. She is walking 3 days per week and doing resistance training 5 days per week.    Her highest weight was 257 lbs   Her nadir weight was 175 mg > 20 years ago.   Pharmacotherapy for weight loss: She is currently taking Wegovy  1 mg for medical weight loss.  Reports side effects of hair loss.     Previous pharmacotherapy for medical weight loss:  None   Bariatric surgery:  Patient has not had bariatric surgery.    PHYSICAL EXAM:  Blood pressure 101/68, pulse 69, temperature 98.1 F (36.7 C), height 5' 3 (1.6 m), weight 177 lb (80.3 kg), SpO2 100%. Body  mass index is 31.35 kg/m.  General: She is overweight, cooperative, alert, well developed, and in no acute distress. PSYCH: Has normal mood, affect and thought process.   Extremities: No edema.  Neurologic: No gross sensory or motor deficits. No tremors or fasciculations noted.    DIAGNOSTIC DATA REVIEWED:  BMET    Component Value Date/Time   NA 139 07/25/2023 0823   K 4.9 07/25/2023 0823   CL 104 07/25/2023 0823   CO2 21 07/25/2023 0823   GLUCOSE 78 07/25/2023 0823   GLUCOSE 88 11/05/2015 0228   BUN 10 07/25/2023 0823   CREATININE 0.73 07/25/2023 0823   CALCIUM 9.5 07/25/2023 0823   GFRNONAA 112 07/24/2017 1138   GFRAA 130 07/24/2017 1138   Lab Results  Component Value Date   HGBA1C 5.4 12/12/2022   HGBA1C 5.4 01/20/2022   Lab Results  Component Value Date   INSULIN  18.8 12/12/2022   Lab Results  Component Value Date   TSH 3.280 07/25/2023   CBC    Component Value Date/Time   WBC CANCELED 07/25/2023 0823   WBC 7.6 11/05/2015 0228   RBC CANCELED 07/25/2023 0823   RBC 4.74 11/05/2015 0228   HGB CANCELED 07/25/2023 0823   HCT CANCELED 07/25/2023 0823   PLT CANCELED 07/25/2023 0823   MCV 91 01/01/2023 0755   MCH 30.5 01/01/2023 0755  MCH 23.6 (L) 11/05/2015 0228   MCHC 33.4 01/01/2023 0755   MCHC 30.1 11/05/2015 0228   RDW 12.8 01/01/2023 0755   Iron Studies No results found for: IRON, TIBC, FERRITIN, IRONPCTSAT Lipid Panel     Component Value Date/Time   CHOL 175 07/25/2023 0823   TRIG 123 07/25/2023 0823   HDL 41 07/25/2023 0823   CHOLHDL 4.2 01/20/2022 0856   LDLCALC 112 (H) 07/25/2023 0823   Hepatic Function Panel     Component Value Date/Time   PROT 6.8 07/25/2023 0823   ALBUMIN 4.4 07/25/2023 0823   AST 20 07/25/2023 0823   ALT 17 07/25/2023 0823   ALKPHOS 97 07/25/2023 0823   BILITOT 0.5 07/25/2023 0823      Component Value Date/Time   TSH 3.280 07/25/2023 0823   Nutritional Lab Results  Component Value Date   VD25OH 55.0  07/25/2023   VD25OH 16.9 (L) 12/12/2022     ASSESSMENT AND PLAN  TREATMENT PLAN FOR OBESITY:  Recommended Dietary Goals  Celsey is currently in the action stage of change. As such, her goal is to continue weight management plan. She has agreed to keeping a food journal and adhering to recommended goals of 1500 calories and 100+ grams of protein.  Behavioral Intervention  We discussed the following Behavioral Modification Strategies today: increasing lean protein intake to established goals, decreasing simple carbohydrates , increasing vegetables, increasing fiber rich foods, increasing water intake , and continue to work on maintaining a reduced calorie state, getting the recommended amount of protein, incorporating whole foods, making healthy choices, staying well hydrated and practicing mindfulness when eating..  Additional resources provided today: NA  Recommended Physical Activity Goals  Clairessa has been advised to work up to 150 minutes of moderate intensity aerobic activity a week and strengthening exercises 2-3 times per week for cardiovascular health, weight loss maintenance and preservation of muscle mass.   She has agreed to Continue current level of physical activity , Think about enjoyable ways to increase daily physical activity and overcoming barriers to exercise, Increase physical activity in their day and reduce sedentary time (increase NEAT)., and continue to gradually increase the amount and intensity of exercise routine   Pharmacotherapy We discussed various medication options to help Kenyata with her weight loss efforts and we both agreed to continue Wegovy  1mg . Side effects discussed.    ASSOCIATED CONDITIONS ADDRESSED TODAY  Action/Plan  Vitamin D  deficiency -     Vitamin D  (Ergocalciferol ); Take 1 capsule (50,000 Units total) by mouth every 7 (seven) days.  Dispense: 5 capsule; Refill: 0  Hair loss Will continue to monitor Discussed starting a MVI and a hair,  skin and nails supplement.  Increase protein and water intake.  Discussed decreasing  Wegovy  to 0.5mg .  Patient would like to continue Wegovy  1mg .  If worsens or persist to let me know.    Class 1 drug-induced obesity without serious comorbidity with body mass index (BMI) of 32.0 to 32.9 in adult -     Wegovy ; Inject 1 mg into the skin once a week. [id number]  Dispense: 2 mL; Refill: 0      Seeing endo in Sept for follow up and labs   Return in about 4 weeks (around 02/07/2024).SABRA She was informed of the importance of frequent follow up visits to maximize her success with intensive lifestyle modifications for her multiple health conditions.   ATTESTASTION STATEMENTS:  Reviewed by clinician on day of visit: allergies, medications, problem list, medical history, surgical  history, family history, social history, and previous encounter notes.     Corean SAUNDERS. Elzabeth Mcquerry FNP-C

## 2024-02-13 ENCOUNTER — Other Ambulatory Visit: Payer: Self-pay

## 2024-02-13 ENCOUNTER — Encounter: Payer: Self-pay | Admitting: Nurse Practitioner

## 2024-02-13 ENCOUNTER — Ambulatory Visit (INDEPENDENT_AMBULATORY_CARE_PROVIDER_SITE_OTHER): Admitting: Nurse Practitioner

## 2024-02-13 ENCOUNTER — Other Ambulatory Visit (HOSPITAL_COMMUNITY): Payer: Self-pay

## 2024-02-13 VITALS — BP 104/73 | HR 72 | Temp 98.2°F | Ht 63.0 in | Wt 174.0 lb

## 2024-02-13 DIAGNOSIS — R632 Polyphagia: Secondary | ICD-10-CM | POA: Diagnosis not present

## 2024-02-13 DIAGNOSIS — Z6830 Body mass index (BMI) 30.0-30.9, adult: Secondary | ICD-10-CM

## 2024-02-13 DIAGNOSIS — E66811 Obesity, class 1: Secondary | ICD-10-CM | POA: Diagnosis not present

## 2024-02-13 MED ORDER — WEGOVY 1 MG/0.5ML ~~LOC~~ SOAJ
1.0000 mg | SUBCUTANEOUS | 1 refills | Status: DC
  Filled 2024-02-13: qty 2, 28d supply, fill #0

## 2024-02-13 NOTE — Progress Notes (Signed)
 Office: [phone]  /  Fax: [phone]  WEIGHT SUMMARY AND BIOMETRICS  Weight Lost Since Last Visit: 3lb  Weight Gained Since Last Visit: 0lb   Vitals Temp: 98.2 F (36.8 C) BP: 104/73 Pulse Rate: 72 SpO2: 98 %   Anthropometric Measurements Height: 5' 3 (1.6 m) Weight: 174 lb (78.9 kg) BMI (Calculated): 30.83 Weight at Last Visit: 177lb Weight Lost Since Last Visit: 3lb Weight Gained Since Last Visit: 0lb Starting Weight: 251lb Total Weight Loss (lbs): 77 lb (34.9 kg) Peak Weight: 251lb   Body Composition  Body Fat %: 37.9 % Fat Mass (lbs): 66 lbs Muscle Mass (lbs): 103 lbs Total Body Water (lbs): 70.2 lbs Visceral Fat Rating : 8   Other Clinical Data Fasting: No Labs: No Today's Visit #: 16 Starting Date: 12/12/22     HPI  Chief Complaint: OBESITY  Krystal Kim is here to discuss her progress with her obesity treatment plan. She is on the the Category 2 Plan and states she is following her eating plan approximately 90 % of the time. She states she is exercising 20-30 minutes 5 days per week.   Interval History:  Since last office visit she has lost 3 pounds.  She recently returned from vacation. She is drinking water, a protein shake, diet soda and coffee.  She is snacking on yogurt, cottage cheese, etc.  She is averaging around 100 grams of protein daily. She recently got a walking pad and is walking daily at work.  Notes polyphagia and cravings.    Her highest weight was 257 lbs   Her nadir weight is her current weight.     Pharmacotherapy for weight loss: She is currently taking Wegovy  1 mg for medical weight loss.  Reports hair loss has improved. She is taking a MVI and a skin, hair and nails .     Previous pharmacotherapy for medical weight loss:  None   Bariatric surgery:  Patient has not had bariatric surgery.     PHYSICAL EXAM:  Blood pressure 104/73, pulse 72, temperature 98.2 F (36.8 C), height 5' 3 (1.6 m), weight 174 lb (78.9 kg),  SpO2 98%. Body mass index is 30.82 kg/m.  General: She is overweight, cooperative, alert, well developed, and in no acute distress. PSYCH: Has normal mood, affect and thought process.   Extremities: No edema.  Neurologic: No gross sensory or motor deficits. No tremors or fasciculations noted.    DIAGNOSTIC DATA REVIEWED:  BMET    Component Value Date/Time   NA 139 07/25/2023 0823   K 4.9 07/25/2023 0823   CL 104 07/25/2023 0823   CO2 21 07/25/2023 0823   GLUCOSE 78 07/25/2023 0823   GLUCOSE 88 11/05/2015 0228   BUN 10 07/25/2023 0823   CREATININE 0.73 07/25/2023 0823   CALCIUM 9.5 07/25/2023 0823   GFRNONAA 112 07/24/2017 1138   GFRAA 130 07/24/2017 1138   Lab Results  Component Value Date   HGBA1C 5.4 12/12/2022   HGBA1C 5.4 01/20/2022   Lab Results  Component Value Date   INSULIN  18.8 12/12/2022   Lab Results  Component Value Date   TSH 3.280 07/25/2023   CBC    Component Value Date/Time   WBC CANCELED 07/25/2023 0823   WBC 7.6 11/05/2015 0228   RBC CANCELED 07/25/2023 0823   RBC 4.74 11/05/2015 0228   HGB CANCELED 07/25/2023 0823   HCT CANCELED 07/25/2023 0823   PLT CANCELED 07/25/2023 0823   MCV 91 01/01/2023 0755   MCH 30.5 01/01/2023  0755   MCH 23.6 (L) 11/05/2015 0228   MCHC 33.4 01/01/2023 0755   MCHC 30.1 11/05/2015 0228   RDW 12.8 01/01/2023 0755   Iron Studies No results found for: IRON, TIBC, FERRITIN, IRONPCTSAT Lipid Panel     Component Value Date/Time   CHOL 175 07/25/2023 0823   TRIG 123 07/25/2023 0823   HDL 41 07/25/2023 0823   CHOLHDL 4.2 01/20/2022 0856   LDLCALC 112 (H) 07/25/2023 0823   Hepatic Function Panel     Component Value Date/Time   PROT 6.8 07/25/2023 0823   ALBUMIN 4.4 07/25/2023 0823   AST 20 07/25/2023 0823   ALT 17 07/25/2023 0823   ALKPHOS 97 07/25/2023 0823   BILITOT 0.5 07/25/2023 0823      Component Value Date/Time   TSH 3.280 07/25/2023 0823   Nutritional Lab Results  Component Value  Date   VD25OH 55.0 07/25/2023   VD25OH 16.9 (L) 12/12/2022     ASSESSMENT AND PLAN  TREATMENT PLAN FOR OBESITY:  Recommended Dietary Goals  Krystal Kim is currently in the action stage of change. As such, her goal is to continue weight management plan. She has agreed to the Category 2 Plan +100.  I have asked her to track and make sure she is meeting her calorie goals.        Behavioral Intervention  We discussed the following Behavioral Modification Strategies today: increasing lean protein intake to established goals, decreasing simple carbohydrates , increasing vegetables, increasing fiber rich foods, increasing water intake , work on meal planning and preparation, keeping healthy foods at home, continue to work on maintaining a reduced calorie state, getting the recommended amount of protein, incorporating whole foods, making healthy choices, staying well hydrated and practicing mindfulness when eating., and increase protein intake, fibrous foods (25 grams per day for women, 30 grams for men) and water to improve satiety and decrease hunger signals. .  Additional resources provided today: NA  Recommended Physical Activity Goals  Krystal Kim has been advised to work up to 150 minutes of moderate intensity aerobic activity a week and strengthening exercises 2-3 times per week for cardiovascular health, weight loss maintenance and preservation of muscle mass.   She has agreed to Think about enjoyable ways to increase daily physical activity and overcoming barriers to exercise, Increase physical activity in their day and reduce sedentary time (increase NEAT)., Continue to gradually increase the amount and intensity of exercise routine, Increase volume of physical activity to a goal of 240 minutes a week, and Combine aerobic and strengthening exercises for efficiency and improved cardiometabolic health.   Pharmacotherapy We discussed various medication options to help Ceazia with her weight loss  efforts and we both agreed to continue Wegovy  1mg .  Side effects discussed.    ASSOCIATED CONDITIONS ADDRESSED TODAY  Action/Plan  Polyphagia -     Continue Wegovy ; Inject 1 mg into the skin once a week.  Dispense: 2 mL; Refill: 1. Side effects discussed. Will continue to monitor hair loss.  If worsens to let me know.    Obesity, Class I, BMI 30-34.9 -     Wegovy ; Inject 1 mg into the skin once a week.  Dispense: 2 mL; Refill: 1.  Side effects discussed.   Patient has overall done well with weight loss.    Bio Impedance reviewed with patient today: Body fat % decreased Muscle mass increased Fat mass decreased Visceral fat decreased     Has appt with endo on 02/29/24   Return in about  5 weeks (around 03/19/2024).SABRA She was informed of the importance of frequent follow up visits to maximize her success with intensive lifestyle modifications for her multiple health conditions.   ATTESTASTION STATEMENTS:  Reviewed by clinician on day of visit: allergies, medications, problem list, medical history, surgical history, family history, social history, and previous encounter notes.      Krystal Kim SAUNDERS. Dayson Aboud FNP-C

## 2024-02-14 ENCOUNTER — Other Ambulatory Visit: Payer: Self-pay | Admitting: Obstetrics and Gynecology

## 2024-02-14 DIAGNOSIS — Z1231 Encounter for screening mammogram for malignant neoplasm of breast: Secondary | ICD-10-CM

## 2024-02-24 ENCOUNTER — Other Ambulatory Visit: Payer: Self-pay | Admitting: Internal Medicine

## 2024-02-24 DIAGNOSIS — R7989 Other specified abnormal findings of blood chemistry: Secondary | ICD-10-CM

## 2024-02-25 ENCOUNTER — Other Ambulatory Visit (HOSPITAL_COMMUNITY): Payer: Self-pay

## 2024-02-25 MED ORDER — LEVOTHYROXINE SODIUM 75 MCG PO TABS
75.0000 ug | ORAL_TABLET | Freq: Every day | ORAL | 3 refills | Status: AC
  Filled 2024-02-25: qty 90, 90d supply, fill #0
  Filled 2024-05-19: qty 90, 90d supply, fill #1

## 2024-02-25 NOTE — Telephone Encounter (Signed)
 Refill request complete

## 2024-02-29 ENCOUNTER — Encounter: Payer: Self-pay | Admitting: Internal Medicine

## 2024-02-29 ENCOUNTER — Ambulatory Visit: Payer: 59 | Admitting: Internal Medicine

## 2024-02-29 VITALS — BP 112/60 | HR 78 | Ht 63.0 in | Wt 177.4 lb

## 2024-02-29 DIAGNOSIS — E063 Autoimmune thyroiditis: Secondary | ICD-10-CM

## 2024-02-29 DIAGNOSIS — E66811 Obesity, class 1: Secondary | ICD-10-CM

## 2024-02-29 NOTE — Patient Instructions (Addendum)
 Please continue levothyroxine  75 mcg daily.  Take the thyroid  hormone every day, with water, at least 30 minutes before breakfast, separated by at least 4 hours from: - acid reflux medications - calcium - iron - multivitamins  Please come back for labs in 3-4 days.  Please come back in 1 year.

## 2024-02-29 NOTE — Progress Notes (Signed)
 Patient ID: Krystal Kim, female   DOB: 26-Jul-1975, 49 y.o.   MRN: [id number]  HPI  Krystal Kim is a 49 y.o.-year-old female, initially referred by her gastroenterologist, Dr.Mann, returning for follow-up for Hashimoto's hypothyroidism.  Last visit 1 year ago.  Interim history: She has no complaints at today's visit. She started Wegovy  since last visit.  She was able to lose 62 pounds since then!! She is is on a high-protein diet.  She is not hungry and sometimes has difficulty eating all the daily protein recommended.  Reviewed history: Patient was seen by Dr. Kristie in 05/2021 for a screening colonoscopy.  At that time, she did mention occasional constipation.  During investigation for this, she was found to have a high TSH.  She was referred to endocrinology.  Patient mentions a h/o large weight fluctuations -she was able to lose weight in the past on a low calorie diet >> 60-90 lbs, however, she gained that back easily.  She previously mentioned fatigue and insomnia: works a full time job and has 4 children.   She has a distant history of regular menstrual cycles and heavy bleeding.  She was on OCPs since a teenager.  She developed a DVT and PE (on OCPs) in 2017 >> could not exercise, relaxed diet >> gained weight.  Now on Pg IUD.  After starting levothyroxine , she started to lose weight.  She started to see the weight management clinic >> started a high-protein diet and Wegovy  - 1 mg weekly.  We started levothyroxine  in 05/2021.  Latest dose increase was in 10/2021.  She takes 75 mcg daily: - in am - fasting - at least 30 min from b'fast and coffee + creamer - no calcium - no iron - + multivitamins at night - no PPIs - + on Biotin -started a B complex since last visit She continues vitamin D  once a week.  I reviewed pt's thyroid  tests: Lab Results  Component Value Date   TSH 3.280 07/25/2023   TSH 2.12 03/01/2023   TSH 5.820 (H) 12/12/2022   TSH 2.13 04/06/2022   TSH 7.04  (H) 02/28/2022   TSH 4.71 10/19/2021   TSH 6.29 (H) 09/02/2021   TSH 4.27 07/13/2021   TSH 7.87 (H) 06/02/2021   FREET4 1.28 07/25/2023   FREET4 1.03 03/01/2023   FREET4 1.07 12/12/2022   FREET4 0.80 04/06/2022   FREET4 0.61 02/28/2022   FREET4 0.83 10/19/2021   FREET4 0.68 09/02/2021   FREET4 0.87 07/13/2021   FREET4 0.53 (L) 06/02/2021   T3FREE 3.0 12/12/2022   T3FREE 3.4 06/02/2021  05/18/2021: TSH 10.1  Antithyroid antibodies: Component     Latest Ref Rng 06/02/2021  Thyroglobulin Ab     < or = 1 IU/mL 63 (H)   Thyroperoxidase Ab SerPl-aCnc     <9 IU/mL 27 (H)    Pt denies feeling nodules in neck, hoarseness, dysphagia/odynophagia.  She has no FH of thyroid  disorders.No FH of thyroid  cancer.  No h/o radiation tx to head or neck. No recent use of iodine supplements.  ROS: Constitutional: + See HPI  Past Medical History:  Diagnosis Date   Anemia    Anemia    Constipation    Hashimoto's disease    Hypothyroidism    Obesity    PE (pulmonary embolism) 10/11/2015   PONV (postoperative nausea and vomiting)    Seasonal allergies    Past Surgical History:  Procedure Laterality Date   CESAREAN SECTION     CESAREAN  SECTION N/A 01/19/2014   Procedure: REPEAT CESAREAN SECTION;  Surgeon: Nena DELENA App, MD;  Location: [facility];  Service: Obstetrics;  Laterality: N/A;   VAGINA RECONSTRUCTION SURGERY     Social History   Socioeconomic History   Marital status: Married    Spouse name: Not on file   Number of children: 4   Years of education: Not on file   Highest education level: Not on file  Occupational History   Occupation: Interior and spatial designer of finance  Tobacco Use   Smoking status: Never   Smokeless tobacco: Never  Substance and Sexual Activity   Alcohol use: Yes    Comment: Occasionally   Drug use: No   Sexual activity: Yes    Birth control/protection: None  Other Topics Concern   Not on file  Social History Narrative   Lives with husband and 3 children.  Works  in Audiological scientist at [employer].   Social Drivers of Corporate investment banker Strain: Not on file  Food Insecurity: Not on file  Transportation Needs: Not on file  Physical Activity: Not on file  Stress: Not on file  Social Connections: Not on file  Intimate Partner Violence: Not on file   Current Outpatient Medications on File Prior to Visit  Medication Sig Dispense Refill   levonorgestrel  (MIRENA , 52 MG,) 20 MCG/DAY IUD Mirena      levothyroxine  (SYNTHROID ) 75 MCG tablet Take 1 tablet (75 mcg total) by mouth daily before breakfast. 90 tablet 3   semaglutide -weight management (WEGOVY ) 1 MG/0.5ML SOAJ SQ injection Inject 1 mg into the skin once a week. 2 mL 1   Vitamin D , Ergocalciferol , (DRISDOL ) 1.25 MG (50000 UNIT) CAPS capsule Take 1 capsule (50,000 Units total) by mouth every 7 (seven) days. 5 capsule 0   No current facility-administered medications on file prior to visit.   Allergies  Allergen Reactions   Codeine     REACTION: Nausea and vomiting   Family History  Problem Relation Age of Onset   Diabetes Father    Obesity Father    Heart disease Maternal Grandmother    Hypertension Paternal Grandfather    Heart disease Paternal Grandfather    Hypertension Son    Diabetes Maternal Aunt    PE: BP 112/60   Pulse 78   Ht 5' 3 (1.6 m)   Wt 177 lb 6.4 oz (80.5 kg)   LMP  (LMP Unknown)   SpO2 95%   BMI 31.42 kg/m  Wt Readings from Last 15 Encounters:  02/29/24 177 lb 6.4 oz (80.5 kg)  02/13/24 174 lb (78.9 kg)  01/10/24 177 lb (80.3 kg)  12/12/23 181 lb (82.1 kg)  11/15/23 185 lb (83.9 kg)  10/17/23 191 lb (86.6 kg)  09/19/23 197 lb (89.4 kg)  08/22/23 202 lb (91.6 kg)  07/25/23 205 lb (93 kg)  06/27/23 209 lb (94.8 kg)  05/23/23 215 lb (97.5 kg)  04/25/23 225 lb (102.1 kg)  03/27/23 231 lb (104.8 kg)  03/01/23 239 lb 9.6 oz (108.7 kg)  02/26/23 239 lb (108.4 kg)   Constitutional: overweight, in NAD Eyes:  EOMI, no exophthalmos ENT: no neck masses, no cervical  lymphadenopathy Cardiovascular: RRR, No MRG Respiratory: CTA B Musculoskeletal: no deformities Skin:no rashes Neurological: no tremor with outstretched hands  ASSESSMENT: 1. Hypothyroidism due to Hashimoto's thyroiditis  2. Obesity class 1  PLAN:  1. Patient with a history of elevated TSH found during investigation for constipation by Dr. Kristie.  She had positive thyroglobulin and TPO antibodies,  confirming Hashimoto's thyroiditis.  She was not initially on levothyroxine  but we ended up starting this at a low dose, of 25 mcg daily and we then had to increase the dose gradually to 75 mcg daily. - latest thyroid  labs reviewed with pt. >> normal: Lab Results  Component Value Date   TSH 3.280 07/25/2023  - she continues on LT4 75 mcg daily - pt feels good on this dose.  She previously complained about inability to lose weight but after joining the weight management clinic, she lost 12 pounds before last visit.  However, she lost almost 62 pounds since then!! - we discussed about taking the thyroid  hormone every day, with water, >30 minutes before breakfast, separated by >4 hours from acid reflux medications, calcium, iron, multivitamins. Pt. is taking it correctly.  No missed doses. - will check thyroid  tests in 3 to 4 days (she took 2500 mcg Biotin in [facility] last night): TSH and fT4 - If labs are abnormal, she will need to return for repeat TFTs in 1.5 months - I plan to see her back in a year but possibly sooner for labs  2. Obesity class 1 - at last OV, she was in the obesity class 3 category - target weight 150 lbs - she is working closely with the weight management clinic.  She is on a high-protein diet and also Wegovy  1 mg weekly.  She does not find it difficult to follow the diet. - we discussed that with weight loss, we sometimes have to reduce the dose of levothyroxine .  We will recheck her TFTs in 3 to 4 days to see if she needs a lower dose.  We also discussed that if  she continues to lose a significant amount of weight within the next 6 to 8 months, we need to check another set of test sooner than our next appointment in a year.  Orders Placed This Encounter  Procedures   TSH   T4, free   Lela Fendt, MD PhD [facility] Endocrinology

## 2024-03-18 ENCOUNTER — Other Ambulatory Visit: Payer: Self-pay

## 2024-03-18 ENCOUNTER — Ambulatory Visit: Admitting: Nurse Practitioner

## 2024-03-18 ENCOUNTER — Encounter: Payer: Self-pay | Admitting: Nurse Practitioner

## 2024-03-18 ENCOUNTER — Other Ambulatory Visit (HOSPITAL_COMMUNITY): Payer: Self-pay

## 2024-03-18 VITALS — BP 111/79 | HR 78 | Temp 98.5°F | Ht 63.0 in | Wt 171.0 lb

## 2024-03-18 DIAGNOSIS — E559 Vitamin D deficiency, unspecified: Secondary | ICD-10-CM | POA: Diagnosis not present

## 2024-03-18 DIAGNOSIS — E66811 Obesity, class 1: Secondary | ICD-10-CM | POA: Diagnosis not present

## 2024-03-18 DIAGNOSIS — Z6830 Body mass index (BMI) 30.0-30.9, adult: Secondary | ICD-10-CM

## 2024-03-18 DIAGNOSIS — R632 Polyphagia: Secondary | ICD-10-CM

## 2024-03-18 DIAGNOSIS — L659 Nonscarring hair loss, unspecified: Secondary | ICD-10-CM

## 2024-03-18 DIAGNOSIS — Z862 Personal history of diseases of the blood and blood-forming organs and certain disorders involving the immune mechanism: Secondary | ICD-10-CM | POA: Diagnosis not present

## 2024-03-18 DIAGNOSIS — E063 Autoimmune thyroiditis: Secondary | ICD-10-CM

## 2024-03-18 DIAGNOSIS — Z79899 Other long term (current) drug therapy: Secondary | ICD-10-CM

## 2024-03-18 MED ORDER — WEGOVY 1.7 MG/0.75ML ~~LOC~~ SOAJ
1.7000 mg | SUBCUTANEOUS | 1 refills | Status: DC
  Filled 2024-03-18: qty 3, 28d supply, fill #0
  Filled 2024-04-11: qty 3, 28d supply, fill #1

## 2024-03-18 NOTE — Progress Notes (Signed)
 Office: [phone]  /  Fax: [phone]  WEIGHT SUMMARY AND BIOMETRICS  Weight Lost Since Last Visit: 3lb  Weight Gained Since Last Visit: 0lb   Vitals Temp: 98.5 F (36.9 C) BP: 111/79 Pulse Rate: 78 SpO2: 99 %   Anthropometric Measurements Height: 5' 3 (1.6 m) Weight: 171 lb (77.6 kg) BMI (Calculated): 30.3 Weight at Last Visit: 174lb Weight Lost Since Last Visit: 3lb Weight Gained Since Last Visit: 0lb Starting Weight: 251lb Total Weight Loss (lbs): 80 lb (36.3 kg)   Body Composition  Body Fat %: 38.2 % Fat Mass (lbs): 65.6 lbs Muscle Mass (lbs): 100.8 lbs Total Body Water (lbs): 70.2 lbs Visceral Fat Rating : 8   Other Clinical Data Fasting: Yes Labs: Yes Today's Visit #: 17 Starting Date: 12/12/22     HPI  Chief Complaint: OBESITY  Krystal Kim is here to discuss her progress with her obesity treatment plan. She is on the the Category 2 Plan and states she is following her eating plan approximately 75 % of the time. She states she is exercising 30-60 minutes 5 days per week.   Interval History:  Since last office visit she has lost 3 pounds.  She has been struggling with food noises and notes it has been rough.  She is monitoring her protein and aiming to meet her protein goals.  She is drinking a protein shake daily. She is also drinking water and a diet soda daily.   She is walking 5 days per week and doing resistance training 3 days per week.   Her highest weight was 257 lbs   Her nadir weight is her current weight.     Pharmacotherapy for weight loss: She is currently taking Wegovy  1 mg for medical weight loss.  Reports hair loss has improved. She is taking a MVI and a skin, hair and nails (has been off for the past 2 weeks due to needing labs-TSH) .     Previous pharmacotherapy for medical weight loss:  None   Bariatric surgery:  Patient has not had bariatric surgery.    Hypothyroidism Stable.  Does not report symptoms associated with  uncontrolled hypothyroidism. Medication(s): Levothyroxine  75 mcg daily. Lab Results  Component Value Date   TSH 3.280 07/25/2023    Vit D deficiency  She is taking Vit D 50,000 IU weekly-stopped 1 week ago.  Denies side effects.  Denies nausea, vomiting or muscle weakness.    Lab Results  Component Value Date   VD25OH 55.0 07/25/2023   VD25OH 16.9 (L) 12/12/2022    History of anemia-hair loss Taking a MVI daily along with skin, hair and nails (stopped 2 weeks ago for labs).   PHYSICAL EXAM:  Blood pressure 111/79, pulse 78, temperature 98.5 F (36.9 C), height 5' 3 (1.6 m), weight 171 lb (77.6 kg), SpO2 99%. Body mass index is 30.29 kg/m.  General: She is overweight, cooperative, alert, well developed, and in no acute distress. PSYCH: Has normal mood, affect and thought process.   Extremities: No edema.  Neurologic: No gross sensory or motor deficits. No tremors or fasciculations noted.    DIAGNOSTIC DATA REVIEWED:  BMET    Component Value Date/Time   NA 139 07/25/2023 0823   K 4.9 07/25/2023 0823   CL 104 07/25/2023 0823   CO2 21 07/25/2023 0823   GLUCOSE 78 07/25/2023 0823   GLUCOSE 88 11/05/2015 0228   BUN 10 07/25/2023 0823   CREATININE 0.73 07/25/2023 0823   CALCIUM 9.5 07/25/2023 0823  GFRNONAA 112 07/24/2017 1138   GFRAA 130 07/24/2017 1138   Lab Results  Component Value Date   HGBA1C 5.4 12/12/2022   HGBA1C 5.4 01/20/2022   Lab Results  Component Value Date   INSULIN  18.8 12/12/2022   Lab Results  Component Value Date   TSH 3.280 07/25/2023   CBC    Component Value Date/Time   WBC CANCELED 07/25/2023 0823   WBC 7.6 11/05/2015 0228   RBC CANCELED 07/25/2023 0823   RBC 4.74 11/05/2015 0228   HGB CANCELED 07/25/2023 0823   HCT CANCELED 07/25/2023 0823   PLT CANCELED 07/25/2023 0823   MCV 91 01/01/2023 0755   MCH 30.5 01/01/2023 0755   MCH 23.6 (L) 11/05/2015 0228   MCHC 33.4 01/01/2023 0755   MCHC 30.1 11/05/2015 0228   RDW 12.8  01/01/2023 0755   Iron Studies No results found for: IRON, TIBC, FERRITIN, IRONPCTSAT Lipid Panel     Component Value Date/Time   CHOL 175 07/25/2023 0823   TRIG 123 07/25/2023 0823   HDL 41 07/25/2023 0823   CHOLHDL 4.2 01/20/2022 0856   LDLCALC 112 (H) 07/25/2023 0823   Hepatic Function Panel     Component Value Date/Time   PROT 6.8 07/25/2023 0823   ALBUMIN 4.4 07/25/2023 0823   AST 20 07/25/2023 0823   ALT 17 07/25/2023 0823   ALKPHOS 97 07/25/2023 0823   BILITOT 0.5 07/25/2023 0823      Component Value Date/Time   TSH 3.280 07/25/2023 0823   Nutritional Lab Results  Component Value Date   VD25OH 55.0 07/25/2023   VD25OH 16.9 (L) 12/12/2022     ASSESSMENT AND PLAN  TREATMENT PLAN FOR OBESITY:  Recommended Dietary Goals  Annisten is currently in the action stage of change. As such, her goal is to continue weight management plan. She has agreed to the Category 2 Plan.  Behavioral Intervention  We discussed the following Behavioral Modification Strategies today: increasing lean protein intake to established goals, decreasing simple carbohydrates , increasing fiber rich foods, increasing water intake , continue to work on maintaining a reduced calorie state, getting the recommended amount of protein, incorporating whole foods, making healthy choices, staying well hydrated and practicing mindfulness when eating., and increase protein intake, fibrous foods (25 grams per day for women, 30 grams for men) and water to improve satiety and decrease hunger signals. .  Additional resources provided today: NA  Recommended Physical Activity Goals  Hopelynn has been advised to work up to 150 minutes of moderate intensity aerobic activity a week and strengthening exercises 2-3 times per week for cardiovascular health, weight loss maintenance and preservation of muscle mass.   She has agreed to Think about enjoyable ways to increase daily physical activity and overcoming  barriers to exercise, Increase physical activity in their day and reduce sedentary time (increase NEAT)., Continue to gradually increase the amount and intensity of exercise routine, Increase volume of physical activity to a goal of 240 minutes a week, and Combine aerobic and strengthening exercises for efficiency and improved cardiometabolic health.   Pharmacotherapy We discussed various medication options to help Adiana with her weight loss efforts and we both agreed to increase Wegovy  1.7mg  due to worsening of cravings/polyphagia.  Side effects discussed.  Will monitor for hair loss and muscle mass.  Bio Impedance reviewed with the patient.  ASSOCIATED CONDITIONS ADDRESSED TODAY  Action/Plan  Hypothyroidism due to Hashimoto's thyroiditis -     TSH -     T4, free  To  send labs to endo. Continue to follow up with endo and take meds as directed  Polyphagia -     Wegovy ; Inject 1.7 mg into the skin once a week.  Dispense: 3 mL; Refill: 1  Hair loss -     TSH -     T4, free -     Comprehensive metabolic panel with GFR -     VITAMIN D  25 Hydroxy (Vit-D Deficiency, Fractures) -     CBC with Differential/Platelet -     Ferritin -     Iron  History of anemia -     TSH -     T4, free -     CBC with Differential/Platelet -     Ferritin -     Iron  Vitamin D  deficiency -     VITAMIN D  25 Hydroxy (Vit-D Deficiency, Fractures)  Medication management -     Comprehensive metabolic panel with GFR  Obesity, Class I, BMI 30-34.9 -     Wegovy ; Inject 1.7 mg into the skin once a week.  Dispense: 3 mL; Refill: 1  Will monitor for worsening of hair loss.  Doing well currently.       Return in about 5 weeks (around 04/22/2024).SABRA She was informed of the importance of frequent follow up visits to maximize her success with intensive lifestyle modifications for her multiple health conditions.   ATTESTASTION STATEMENTS:  Reviewed by clinician on day of visit: allergies, medications,  problem list, medical history, surgical history, family history, social history, and previous encounter notes.     Corean SAUNDERS. Belal Scallon FNP-C

## 2024-03-19 ENCOUNTER — Ambulatory Visit: Payer: Self-pay | Admitting: Nurse Practitioner

## 2024-03-19 LAB — FERRITIN: Ferritin: 307 ng/mL — ABNORMAL HIGH (ref 15–150)

## 2024-03-19 LAB — COMPREHENSIVE METABOLIC PANEL WITH GFR
ALT: 18 IU/L (ref 0–32)
AST: 20 IU/L (ref 0–40)
Albumin: 4.3 g/dL (ref 3.9–4.9)
Alkaline Phosphatase: 94 IU/L (ref 41–116)
BUN/Creatinine Ratio: 19 (ref 9–23)
BUN: 14 mg/dL (ref 6–24)
Bilirubin Total: 0.5 mg/dL (ref 0.0–1.2)
CO2: 23 mmol/L (ref 20–29)
Calcium: 9.5 mg/dL (ref 8.7–10.2)
Chloride: 104 mmol/L (ref 96–106)
Creatinine, Ser: 0.75 mg/dL (ref 0.57–1.00)
Globulin, Total: 2.6 g/dL (ref 1.5–4.5)
Glucose: 83 mg/dL (ref 70–99)
Potassium: 4.7 mmol/L (ref 3.5–5.2)
Sodium: 142 mmol/L (ref 134–144)
Total Protein: 6.9 g/dL (ref 6.0–8.5)
eGFR: 98 mL/min/1.73 (ref >59)

## 2024-03-19 LAB — CBC WITH DIFFERENTIAL/PLATELET
Basophils Absolute: 0 x10E3/uL (ref 0.0–0.2)
Basos: 0 %
EOS (ABSOLUTE): 0.2 x10E3/uL (ref 0.0–0.4)
Eos: 2 %
Hematocrit: 47 % — ABNORMAL HIGH (ref 34.0–46.6)
Hemoglobin: 14.6 g/dL (ref 11.1–15.9)
Immature Grans (Abs): 0 x10E3/uL (ref 0.0–0.1)
Immature Granulocytes: 0 %
Lymphocytes Absolute: 2 x10E3/uL (ref 0.7–3.1)
Lymphs: 26 %
MCH: 30.2 pg (ref 26.6–33.0)
MCHC: 31.1 g/dL — ABNORMAL LOW (ref 31.5–35.7)
MCV: 97 fL (ref 79–97)
Monocytes Absolute: 0.6 x10E3/uL (ref 0.1–0.9)
Monocytes: 8 %
Neutrophils Absolute: 4.8 x10E3/uL (ref 1.4–7.0)
Neutrophils: 63 %
Platelets: 249 x10E3/uL (ref 150–450)
RBC: 4.83 x10E6/uL (ref 3.77–5.28)
RDW: 12.7 % (ref 11.7–15.4)
WBC: 7.6 x10E3/uL (ref 3.4–10.8)

## 2024-03-19 LAB — VITAMIN D 25 HYDROXY (VIT D DEFICIENCY, FRACTURES): Vit D, 25-Hydroxy: 52.6 ng/mL (ref 30.0–100.0)

## 2024-03-19 LAB — TSH: TSH: 4.02 u[IU]/mL (ref 0.450–4.500)

## 2024-03-19 LAB — T4, FREE: Free T4: 1.31 ng/dL (ref 0.82–1.77)

## 2024-03-19 LAB — IRON: Iron: 103 ug/dL (ref 27–159)

## 2024-03-27 ENCOUNTER — Other Ambulatory Visit: Payer: Self-pay | Admitting: Obstetrics and Gynecology

## 2024-03-27 ENCOUNTER — Encounter: Payer: Self-pay | Admitting: Internal Medicine

## 2024-03-27 DIAGNOSIS — Z1231 Encounter for screening mammogram for malignant neoplasm of breast: Secondary | ICD-10-CM

## 2024-04-11 ENCOUNTER — Other Ambulatory Visit (HOSPITAL_COMMUNITY): Payer: Self-pay

## 2024-04-11 ENCOUNTER — Other Ambulatory Visit: Payer: Self-pay

## 2024-04-22 ENCOUNTER — Ambulatory Visit
Admission: RE | Admit: 2024-04-22 | Discharge: 2024-04-22 | Disposition: A | Discharge status: Home / Self Care | Source: Ambulatory Visit

## 2024-04-22 DIAGNOSIS — Z1231 Encounter for screening mammogram for malignant neoplasm of breast: Secondary | ICD-10-CM

## 2024-04-23 DIAGNOSIS — Z6830 Body mass index (BMI) 30.0-30.9, adult: Secondary | ICD-10-CM | POA: Diagnosis not present

## 2024-04-23 DIAGNOSIS — Z3040 Encounter for surveillance of contraceptives, unspecified: Secondary | ICD-10-CM | POA: Diagnosis not present

## 2024-04-23 DIAGNOSIS — Z01419 Encounter for gynecological examination (general) (routine) without abnormal findings: Secondary | ICD-10-CM | POA: Diagnosis not present

## 2024-04-23 DIAGNOSIS — Z1211 Encounter for screening for malignant neoplasm of colon: Secondary | ICD-10-CM | POA: Diagnosis not present

## 2024-04-23 DIAGNOSIS — Z1342 Encounter for screening for global developmental delays (milestones): Secondary | ICD-10-CM | POA: Diagnosis not present

## 2024-04-23 DIAGNOSIS — Z1231 Encounter for screening mammogram for malignant neoplasm of breast: Secondary | ICD-10-CM | POA: Diagnosis not present

## 2024-04-23 DIAGNOSIS — Z1330 Encounter for screening examination for mental health and behavioral disorders, unspecified: Secondary | ICD-10-CM | POA: Diagnosis not present

## 2024-04-29 ENCOUNTER — Other Ambulatory Visit (HOSPITAL_COMMUNITY): Payer: Self-pay

## 2024-04-29 ENCOUNTER — Encounter: Payer: Self-pay | Admitting: Nurse Practitioner

## 2024-04-29 ENCOUNTER — Ambulatory Visit: Admitting: Nurse Practitioner

## 2024-04-29 VITALS — BP 106/72 | HR 83 | Temp 98.0°F | Ht 63.0 in | Wt 167.0 lb

## 2024-04-29 DIAGNOSIS — E669 Obesity, unspecified: Secondary | ICD-10-CM | POA: Diagnosis not present

## 2024-04-29 DIAGNOSIS — R632 Polyphagia: Secondary | ICD-10-CM

## 2024-04-29 DIAGNOSIS — Z6829 Body mass index (BMI) 29.0-29.9, adult: Secondary | ICD-10-CM | POA: Diagnosis not present

## 2024-04-29 DIAGNOSIS — R7989 Other specified abnormal findings of blood chemistry: Secondary | ICD-10-CM

## 2024-04-29 MED ORDER — WEGOVY 1.7 MG/0.75ML ~~LOC~~ SOAJ
1.7000 mg | SUBCUTANEOUS | 1 refills | Status: DC
  Filled 2024-04-29 – 2024-05-02 (×2): qty 3, 28d supply, fill #0

## 2024-04-29 NOTE — Progress Notes (Signed)
 Office: [phone]  /  Fax: [phone]  WEIGHT SUMMARY AND BIOMETRICS  Weight Lost Since Last Visit: 4lb  Weight Gained Since Last Visit: 0lb   Vitals Temp: 98 F (36.7 C) BP: 106/72 Pulse Rate: 83 SpO2: 100 %   Anthropometric Measurements Height: 5' 3 (1.6 m) Weight: 167 lb (75.8 kg) BMI (Calculated): 29.59 Weight at Last Visit: 171lb Weight Lost Since Last Visit: 4lb Weight Gained Since Last Visit: 0lb Starting Weight: 251lb Total Weight Loss (lbs): 84 lb (38.1 kg)   Body Composition  Body Fat %: 37 % Fat Mass (lbs): 62 lbs Muscle Mass (lbs): 100.4 lbs Total Body Water (lbs): 68.6 lbs Visceral Fat Rating : 8   Other Clinical Data Fasting: Yes Labs: No Today's Visit #: 18 Starting Date: 12/12/22     HPI  Chief Complaint: OBESITY  Krystal Kim is here to discuss her progress with her obesity treatment plan. She is on the the Category 2 Plan and states she is following her eating plan approximately 80-85 % of the time. She states she is exercising 20 minutes 5 days per week.   Interval History:  Since last office visit she has lost 4 pounds.  She has overall done well weight weight loss.  She has traveled and has been sick since her last visit.  She struggled with meeting her calories and protein goals when she was sick.  She is now feeling better and is meeting her calories and protein goals.  She is drinking water daily.  She is walking daily at work and doing resistance training 5 days per week.    Her highest weight was 257 lbs   Her nadir weight is her current weight.     Pharmacotherapy for weight loss: She is currently taking Wegovy  1.7 mg for medical weight loss.  Reports hair loss has improved. She is taking a MVI and a skin, hair and nails.  Has helped with polyphagia and cravings.      Previous pharmacotherapy for medical weight loss:  None   Bariatric surgery:  Patient has not had bariatric surgery.    Elevated ferritin Will recheck labs  at next visit.  Notes she was sick right after her last labs and started feeling better 1-2 weeks ago.  She is taking a MVI daily.       PHYSICAL EXAM:  Blood pressure 106/72, pulse 83, temperature 98 F (36.7 C), height 5' 3 (1.6 m), weight 167 lb (75.8 kg), SpO2 100%. Body mass index is 29.58 kg/m.  General: She is overweight, cooperative, alert, well developed, and in no acute distress. PSYCH: Has normal mood, affect and thought process.   Extremities: No edema.  Neurologic: No gross sensory or motor deficits. No tremors or fasciculations noted.    DIAGNOSTIC DATA REVIEWED:  BMET    Component Value Date/Time   NA 142 03/18/2024 0746   K 4.7 03/18/2024 0746   CL 104 03/18/2024 0746   CO2 23 03/18/2024 0746   GLUCOSE 83 03/18/2024 0746   GLUCOSE 88 11/05/2015 0228   BUN 14 03/18/2024 0746   CREATININE 0.75 03/18/2024 0746   CALCIUM 9.5 03/18/2024 0746   GFRNONAA 112 07/24/2017 1138   GFRAA 130 07/24/2017 1138   Lab Results  Component Value Date   HGBA1C 5.4 12/12/2022   HGBA1C 5.4 01/20/2022   Lab Results  Component Value Date   INSULIN  18.8 12/12/2022   Lab Results  Component Value Date   TSH 4.020 03/18/2024   CBC  Component Value Date/Time   WBC 7.6 03/18/2024 0746   WBC 7.6 11/05/2015 0228   RBC 4.83 03/18/2024 0746   RBC 4.74 11/05/2015 0228   HGB 14.6 03/18/2024 0746   HCT 47.0 (H) 03/18/2024 0746   PLT 249 03/18/2024 0746   MCV 97 03/18/2024 0746   MCH 30.2 03/18/2024 0746   MCH 23.6 (L) 11/05/2015 0228   MCHC 31.1 (L) 03/18/2024 0746   MCHC 30.1 11/05/2015 0228   RDW 12.7 03/18/2024 0746   Iron Studies    Component Value Date/Time   IRON 103 03/18/2024 0746   FERRITIN 307 (H) 03/18/2024 0746   Lipid Panel     Component Value Date/Time   CHOL 175 07/25/2023 0823   TRIG 123 07/25/2023 0823   HDL 41 07/25/2023 0823   CHOLHDL 4.2 01/20/2022 0856   LDLCALC 112 (H) 07/25/2023 0823   Hepatic Function Panel     Component Value  Date/Time   PROT 6.9 03/18/2024 0746   ALBUMIN 4.3 03/18/2024 0746   AST 20 03/18/2024 0746   ALT 18 03/18/2024 0746   ALKPHOS 94 03/18/2024 0746   BILITOT 0.5 03/18/2024 0746      Component Value Date/Time   TSH 4.020 03/18/2024 0746   Nutritional Lab Results  Component Value Date   VD25OH 52.6 03/18/2024   VD25OH 55.0 07/25/2023   VD25OH 16.9 (L) 12/12/2022     ASSESSMENT AND PLAN  TREATMENT PLAN FOR OBESITY:  Recommended Dietary Goals  Sada is currently in the action stage of change. As such, her goal is to continue weight management plan. She has agreed to keeping a food journal and adhering to recommended goals of 1500 calories and 100+ grams of protein.  Behavioral Intervention  We discussed the following Behavioral Modification Strategies today: continue to work on implementation of reduced calorie nutritional plan, continue to practice mindfulness when eating, staying on track while traveling and vacationing, continue to work on maintaining a reduced calorie state, getting the recommended amount of protein, incorporating whole foods, making healthy choices, staying well hydrated and practicing mindfulness when eating., and increase protein intake, fibrous foods (25 grams per day for women, 30 grams for men) and water to improve satiety and decrease hunger signals. .  Additional resources provided today: NA  Recommended Physical Activity Goals  Andrell has been advised to work up to 150 minutes of moderate intensity aerobic activity a week and strengthening exercises 2-3 times per week for cardiovascular health, weight loss maintenance and preservation of muscle mass.   She has agreed to Think about enjoyable ways to increase daily physical activity and overcoming barriers to exercise, Increase physical activity in their day and reduce sedentary time (increase NEAT)., Continue to gradually increase the amount and intensity of exercise routine, Increase volume of physical  activity to a goal of 240 minutes a week, and Combine aerobic and strengthening exercises for efficiency and improved cardiometabolic health.   Pharmacotherapy We discussed various medication options to help Felishia with her weight loss efforts and we both agreed to continue Wegovy  1.7mg .  doing well.  Side effects discussed. .  ASSOCIATED CONDITIONS ADDRESSED TODAY  Action/Plan  Elevated ferritin Will recheck labs at next visit.   Polyphagia -     Wegovy ; Inject 1.7 mg into the skin once a week.  Dispense: 3 mL; Refill: 1  Generalized obesity -     Wegovy ; Inject 1.7 mg into the skin once a week.  Dispense: 3 mL; Refill: 1  BMI 29.0-29.9,adult  Labs reviewed in chart with patient from 03/18/24    Return in about 5 weeks (around 06/03/2024).SABRA She was informed of the importance of frequent follow up visits to maximize her success with intensive lifestyle modifications for her multiple health conditions.   ATTESTASTION STATEMENTS:  Reviewed by clinician on day of visit: allergies, medications, problem list, medical history, surgical history, family history, social history, and previous encounter notes.     Corean SAUNDERS. Britiany Silbernagel FNP-C

## 2024-05-02 ENCOUNTER — Other Ambulatory Visit (HOSPITAL_COMMUNITY): Payer: Self-pay

## 2024-05-02 ENCOUNTER — Other Ambulatory Visit: Payer: Self-pay

## 2024-05-19 ENCOUNTER — Other Ambulatory Visit: Payer: Self-pay

## 2024-05-20 ENCOUNTER — Other Ambulatory Visit: Payer: Self-pay

## 2024-05-29 ENCOUNTER — Other Ambulatory Visit: Payer: Self-pay

## 2024-05-29 ENCOUNTER — Other Ambulatory Visit (HOSPITAL_COMMUNITY): Payer: Self-pay

## 2024-06-02 ENCOUNTER — Encounter: Payer: Self-pay | Admitting: Nurse Practitioner

## 2024-06-02 ENCOUNTER — Ambulatory Visit: Admitting: Nurse Practitioner

## 2024-06-02 ENCOUNTER — Other Ambulatory Visit (HOSPITAL_COMMUNITY): Payer: Self-pay

## 2024-06-02 VITALS — BP 113/75 | HR 71 | Temp 97.8°F | Ht 63.0 in | Wt 167.0 lb

## 2024-06-02 DIAGNOSIS — E669 Obesity, unspecified: Secondary | ICD-10-CM | POA: Diagnosis not present

## 2024-06-02 DIAGNOSIS — R7989 Other specified abnormal findings of blood chemistry: Secondary | ICD-10-CM | POA: Diagnosis not present

## 2024-06-02 DIAGNOSIS — Z6829 Body mass index (BMI) 29.0-29.9, adult: Secondary | ICD-10-CM | POA: Diagnosis not present

## 2024-06-02 DIAGNOSIS — R632 Polyphagia: Secondary | ICD-10-CM

## 2024-06-02 MED ORDER — WEGOVY 1.7 MG/0.75ML ~~LOC~~ SOAJ
1.7000 mg | SUBCUTANEOUS | 1 refills | Status: DC
  Filled 2024-06-02: qty 3, 28d supply, fill #0
  Filled 2024-06-27: qty 3, 28d supply, fill #1

## 2024-06-02 NOTE — Progress Notes (Signed)
 "  Office: [phone]  /  Fax: [phone]  WEIGHT SUMMARY AND BIOMETRICS  Weight Lost Since Last Visit: 0lb  Weight Gained Since Last Visit: 0lb   Vitals Temp: 97.8 F (36.6 C) BP: 113/75 Pulse Rate: 71 SpO2: 100 %   Anthropometric Measurements Height: 5' 3 (1.6 m) Weight: 167 lb (75.8 kg) BMI (Calculated): 29.59 Weight at Last Visit: 167lb Weight Lost Since Last Visit: 0lb Weight Gained Since Last Visit: 0lb Starting Weight: 251lb Total Weight Loss (lbs): 84 lb (38.1 kg)   Body Composition  Body Fat %: 38.6 % Fat Mass (lbs): 64.8 lbs Muscle Mass (lbs): 97.8 lbs Total Body Water (lbs): 70.2 lbs Visceral Fat Rating : 8   Other Clinical Data Fasting: No Labs: No Today's Visit #: 19 Starting Date: 12/12/22     HPI  Chief Complaint: OBESITY  Krystal Kim is here to discuss her progress with her obesity treatment plan. She is on the the Category 2 Plan and states she is following her eating plan approximately 60 % of the time. She states she is exercising varying minutes 4-5 days per week.   Interval History:  Since last office visit she has maintained her weight. She has overall done well with weight loss.  She has been to [place], went to several celebrations and celebrated Thanksgiving since her last visit.   She is drinking water and a protein shake daily.  She is walking on her walking pad at work 5 days per week and doing resistance training 5 days per week.      Her highest weight was 257 lbs  Goal weight 160 lbs   Her nadir weight is her current weight.     Pharmacotherapy for weight loss: She is taking Wegovy  1.7 mg for medical weight loss. Denies side effects.    Previous pharmacotherapy for medical weight loss:  None   Bariatric surgery:  Patient has not had bariatric surgery.    Elevated ferritin Patient reports she has been doing well.  Denies any fever, chills, nausea, etc.  She is taking a MVI daily.  Labs due to be rechecked today.     PHYSICAL EXAM:  Blood pressure 113/75, pulse 71, temperature 97.8 F (36.6 C), height 5' 3 (1.6 m), weight 167 lb (75.8 kg), SpO2 100%. Body mass index is 29.58 kg/m.  General: She is overweight, cooperative, alert, well developed, and in no acute distress. PSYCH: Has normal mood, affect and thought process.   Extremities: No edema.  Neurologic: No gross sensory or motor deficits. No tremors or fasciculations noted.    DIAGNOSTIC DATA REVIEWED:  BMET    Component Value Date/Time   NA 142 03/18/2024 0746   K 4.7 03/18/2024 0746   CL 104 03/18/2024 0746   CO2 23 03/18/2024 0746   GLUCOSE 83 03/18/2024 0746   GLUCOSE 88 11/05/2015 0228   BUN 14 03/18/2024 0746   CREATININE 0.75 03/18/2024 0746   CALCIUM 9.5 03/18/2024 0746   GFRNONAA 112 07/24/2017 1138   GFRAA 130 07/24/2017 1138   Lab Results  Component Value Date   HGBA1C 5.4 12/12/2022   HGBA1C 5.4 01/20/2022   Lab Results  Component Value Date   INSULIN  18.8 12/12/2022   Lab Results  Component Value Date   TSH 4.020 03/18/2024   CBC    Component Value Date/Time   WBC 7.6 03/18/2024 0746   WBC 7.6 11/05/2015 0228   RBC 4.83 03/18/2024 0746   RBC 4.74 11/05/2015 0228   HGB 14.6  03/18/2024 0746   HCT 47.0 (H) 03/18/2024 0746   PLT 249 03/18/2024 0746   MCV 97 03/18/2024 0746   MCH 30.2 03/18/2024 0746   MCH 23.6 (L) 11/05/2015 0228   MCHC 31.1 (L) 03/18/2024 0746   MCHC 30.1 11/05/2015 0228   RDW 12.7 03/18/2024 0746   Iron Studies    Component Value Date/Time   IRON 103 03/18/2024 0746   FERRITIN 307 (H) 03/18/2024 0746   Lipid Panel     Component Value Date/Time   CHOL 175 07/25/2023 0823   TRIG 123 07/25/2023 0823   HDL 41 07/25/2023 0823   CHOLHDL 4.2 01/20/2022 0856   LDLCALC 112 (H) 07/25/2023 0823   Hepatic Function Panel     Component Value Date/Time   PROT 6.9 03/18/2024 0746   ALBUMIN 4.3 03/18/2024 0746   AST 20 03/18/2024 0746   ALT 18 03/18/2024 0746   ALKPHOS 94  03/18/2024 0746   BILITOT 0.5 03/18/2024 0746      Component Value Date/Time   TSH 4.020 03/18/2024 0746   Nutritional Lab Results  Component Value Date   VD25OH 52.6 03/18/2024   VD25OH 55.0 07/25/2023   VD25OH 16.9 (L) 12/12/2022     ASSESSMENT AND PLAN  TREATMENT PLAN FOR OBESITY:  Recommended Dietary Goals  Krystal Kim is currently in the action stage of change. As such, her goal is to continue weight management plan. She has agreed to practicing portion control and making smarter food choices, such as increasing vegetables and decreasing simple carbohydrates.  Behavioral Intervention  We discussed the following Behavioral Modification Strategies today: increasing lean protein intake to established goals, decreasing simple carbohydrates , increasing vegetables, increasing fiber rich foods, increasing water intake , work on meal planning and preparation, reading food labels , keeping healthy foods at home, celebration eating strategies, continue to work on maintaining a reduced calorie state, getting the recommended amount of protein, incorporating whole foods, making healthy choices, staying well hydrated and practicing mindfulness when eating., and increase protein intake, fibrous foods (25 grams per day for women, 30 grams for men) and water to improve satiety and decrease hunger signals. .  Additional resources provided today: NA  Recommended Physical Activity Goals  Krystal Kim has been advised to work up to 150 minutes of moderate intensity aerobic activity a week and strengthening exercises 2-3 times per week for cardiovascular health, weight loss maintenance and preservation of muscle mass.   She has agreed to Think about enjoyable ways to increase daily physical activity and overcoming barriers to exercise, Increase physical activity in their day and reduce sedentary time (increase NEAT)., Continue to gradually increase the amount and intensity of exercise routine, Increase volume  of physical activity to a goal of 240 minutes a week, and Combine aerobic and strengthening exercises for efficiency and improved cardiometabolic health.   Pharmacotherapy We discussed various medication options to help Krystal Kim with her weight loss efforts and we both agreed to continue Wegovy  1.7mg .  side effects discussed.  ASSOCIATED CONDITIONS ADDRESSED TODAY  Action/Plan  Elevated ferritin -     Ferritin  Polyphagia -     Wegovy ; Inject 1.7 mg into the skin once a week.  Dispense: 3 mL; Refill: 1  BMI 29.0-29.9,adult  Generalized obesity -     Wegovy ; Inject 1.7 mg into the skin once a week.  Dispense: 3 mL; Refill: 1      Bio Impedance reviewed with the patient Body fat % increased 1.6% Muscle mass increased 2.6 lbs  She is scheduled to see Dr Lowery on 07/23/23  Last labs 03/18/24  Return in about 4 weeks (around 06/30/2024).SABRA She was informed of the importance of frequent follow up visits to maximize her success with intensive lifestyle modifications for her multiple health conditions.   ATTESTASTION STATEMENTS:  Reviewed by clinician on day of visit: allergies, medications, problem list, medical history, surgical history, family history, social history, and previous encounter notes.     Krystal Kim. Krystal Pantano FNP-C "

## 2024-06-03 ENCOUNTER — Ambulatory Visit: Payer: Self-pay | Admitting: Nurse Practitioner

## 2024-06-03 ENCOUNTER — Other Ambulatory Visit: Payer: Self-pay

## 2024-06-03 LAB — FERRITIN: Ferritin: 291 ng/mL — ABNORMAL HIGH (ref 15–150)

## 2024-06-27 ENCOUNTER — Other Ambulatory Visit: Payer: Self-pay

## 2024-06-27 ENCOUNTER — Other Ambulatory Visit (HOSPITAL_COMMUNITY): Payer: Self-pay

## 2024-07-17 ENCOUNTER — Other Ambulatory Visit (HOSPITAL_COMMUNITY): Payer: Self-pay

## 2024-07-17 ENCOUNTER — Other Ambulatory Visit: Payer: Self-pay

## 2024-07-17 ENCOUNTER — Ambulatory Visit: Admitting: Nurse Practitioner

## 2024-07-17 ENCOUNTER — Encounter: Payer: Self-pay | Admitting: Nurse Practitioner

## 2024-07-17 VITALS — BP 104/69 | HR 84 | Temp 98.4°F | Ht 63.0 in | Wt 161.0 lb

## 2024-07-17 DIAGNOSIS — Z6828 Body mass index (BMI) 28.0-28.9, adult: Secondary | ICD-10-CM | POA: Diagnosis not present

## 2024-07-17 DIAGNOSIS — R632 Polyphagia: Secondary | ICD-10-CM

## 2024-07-17 DIAGNOSIS — R7989 Other specified abnormal findings of blood chemistry: Secondary | ICD-10-CM

## 2024-07-17 DIAGNOSIS — E669 Obesity, unspecified: Secondary | ICD-10-CM

## 2024-07-17 MED ORDER — WEGOVY 1.7 MG/0.75ML ~~LOC~~ SOAJ
1.7000 mg | SUBCUTANEOUS | 1 refills | Status: AC
  Filled 2024-07-17: qty 3, 28d supply, fill #0

## 2024-07-17 NOTE — Progress Notes (Signed)
 "  Office: [phone]  /  Fax: [phone]  WEIGHT SUMMARY AND BIOMETRICS  Weight Lost Since Last Visit: 6lb  Weight Gained Since Last Visit: 0lb   Vitals Temp: 98.4 F (36.9 C) BP: 104/69 Pulse Rate: 84 SpO2: 100 %   Anthropometric Measurements Height: 5' 3 (1.6 m) Weight: 161 lb (73 kg) BMI (Calculated): 28.53 Weight at Last Visit: 167lb Weight Lost Since Last Visit: 6lb Weight Gained Since Last Visit: 0lb Starting Weight: 251lb Total Weight Loss (lbs): 90 lb (40.8 kg)   Body Composition  Body Fat %: 36.9 % Fat Mass (lbs): 59.8 lbs Muscle Mass (lbs): 96.8 lbs Total Body Water (lbs): 67.2 lbs Visceral Fat Rating : 8   Other Clinical Data Fasting: No Labs: No Today's Visit #: 20 Starting Date: 12/12/22     HPI  Chief Complaint: OBESITY  Krystal Kim is here to discuss her progress with her obesity treatment plan. She is on the the Category 2 Plan and states she is following her eating plan approximately 80 % of the time. She states she is exercising 20-40 minutes 6 days per week.   Interval History:  Since last office visit she has lost 6 pounds.  She recently purchased a pelaton and has started riding her bike since her last visit.    Her highest weight was 257 lbs   Goal weight 160 lbs   Her nadir weight is her current weight.     Pharmacotherapy for weight loss: She is taking Wegovy  1.7 mg for medical weight loss. Denies side effects.  Has helped with polyphagia and cravings.     Previous pharmacotherapy for medical weight loss:  None   Bariatric surgery:  Patient has not had bariatric surgery.     Elevated ferritin Patient reports she has been doing well.  Denies any fever, chills, nausea, etc.  She is taking a MVI daily.      PHYSICAL EXAM:  Blood pressure 104/69, pulse 84, temperature 98.4 F (36.9 C), height 5' 3 (1.6 m), weight 161 lb (73 kg), SpO2 100%. Body mass index is 28.52 kg/m.  General: She is overweight, cooperative, alert,  well developed, and in no acute distress. PSYCH: Has normal mood, affect and thought process.   Extremities: No edema.  Neurologic: No gross sensory or motor deficits. No tremors or fasciculations noted.    DIAGNOSTIC DATA REVIEWED:  BMET    Component Value Date/Time   NA 142 03/18/2024 0746   K 4.7 03/18/2024 0746   CL 104 03/18/2024 0746   CO2 23 03/18/2024 0746   GLUCOSE 83 03/18/2024 0746   GLUCOSE 88 11/05/2015 0228   BUN 14 03/18/2024 0746   CREATININE 0.75 03/18/2024 0746   CALCIUM 9.5 03/18/2024 0746   GFRNONAA 112 07/24/2017 1138   GFRAA 130 07/24/2017 1138   Lab Results  Component Value Date   HGBA1C 5.4 12/12/2022   HGBA1C 5.4 01/20/2022   Lab Results  Component Value Date   INSULIN  18.8 12/12/2022   Lab Results  Component Value Date   TSH 4.020 03/18/2024   CBC    Component Value Date/Time   WBC 7.6 03/18/2024 0746   WBC 7.6 11/05/2015 0228   RBC 4.83 03/18/2024 0746   RBC 4.74 11/05/2015 0228   HGB 14.6 03/18/2024 0746   HCT 47.0 (H) 03/18/2024 0746   PLT 249 03/18/2024 0746   MCV 97 03/18/2024 0746   MCH 30.2 03/18/2024 0746   MCH 23.6 (L) 11/05/2015 0228   MCHC 31.1 (L)  03/18/2024 0746   MCHC 30.1 11/05/2015 0228   RDW 12.7 03/18/2024 0746   Iron Studies    Component Value Date/Time   IRON 103 03/18/2024 0746   FERRITIN 291 (H) 06/02/2024 1554   Lipid Panel     Component Value Date/Time   CHOL 175 07/25/2023 0823   TRIG 123 07/25/2023 0823   HDL 41 07/25/2023 0823   CHOLHDL 4.2 01/20/2022 0856   LDLCALC 112 (H) 07/25/2023 0823   Hepatic Function Panel     Component Value Date/Time   PROT 6.9 03/18/2024 0746   ALBUMIN 4.3 03/18/2024 0746   AST 20 03/18/2024 0746   ALT 18 03/18/2024 0746   ALKPHOS 94 03/18/2024 0746   BILITOT 0.5 03/18/2024 0746      Component Value Date/Time   TSH 4.020 03/18/2024 0746   Nutritional Lab Results  Component Value Date   VD25OH 52.6 03/18/2024   VD25OH 55.0 07/25/2023   VD25OH 16.9 (L)  12/12/2022     ASSESSMENT AND PLAN  TREATMENT PLAN FOR OBESITY:  Recommended Dietary Goals  Krystal Kim is currently in the action stage of change. As such, her goal is to continue weight management plan. She has agreed to practicing portion control and making smarter food choices, such as increasing vegetables and decreasing simple carbohydrates.  Behavioral Intervention  We discussed the following Behavioral Modification Strategies today: increasing lean protein intake to established goals, decreasing simple carbohydrates , increasing vegetables, increasing fiber rich foods, increasing water intake , work on meal planning and preparation, reading food labels , keeping healthy foods at home, continue to work on maintaining a reduced calorie state, getting the recommended amount of protein, incorporating whole foods, making healthy choices, staying well hydrated and practicing mindfulness when eating., and increase protein intake, fibrous foods (25 grams per day for women, 30 grams for men) and water to improve satiety and decrease hunger signals. .  Additional resources provided today: NA  Recommended Physical Activity Goals  Krystal Kim has been advised to work up to 150 minutes of moderate intensity aerobic activity a week and strengthening exercises 2-3 times per week for cardiovascular health, weight loss maintenance and preservation of muscle mass.   She has agreed to Think about enjoyable ways to increase daily physical activity and overcoming barriers to exercise, Increase physical activity in their day and reduce sedentary time (increase NEAT)., Continue to gradually increase the amount and intensity of exercise routine, Increase volume of physical activity to a goal of 240 minutes a week, and Combine aerobic and strengthening exercises for efficiency and improved cardiometabolic health.   Pharmacotherapy We discussed various medication options to help Krystal Kim with her weight loss efforts and  we both agreed to continue Wegovy  1.7mg . Side effects discussed.  Has overall done well with weight loss.    ASSOCIATED CONDITIONS ADDRESSED TODAY  Action/Plan  Elevated ferritin Labs reviewed with patient .  Discussed referral to hematology today.  Will recheck labs in 2-3 months and then will base next best plan of care on results.    Polyphagia -     Wegovy ; Inject 1.7 mg into the skin once a week.  Dispense: 3 mL; Refill: 1  Generalized obesity -     Wegovy ; Inject 1.7 mg into the skin once a week.  Dispense: 3 mL; Refill: 1  BMI 28.0-28.9,adult     Continue increasing water and protein intake Continue cardio and increase resistance training.     Return in about 5 weeks (around 08/21/2024).SABRA She was informed  of the importance of frequent follow up visits to maximize her success with intensive lifestyle modifications for her multiple health conditions.   ATTESTASTION STATEMENTS:  Reviewed by clinician on day of visit: allergies, medications, problem list, medical history, surgical history, family history, social history, and previous encounter notes.      Corean SAUNDERS. Tajuan Dufault FNP-C "

## 2024-07-22 ENCOUNTER — Institutional Professional Consult (permissible substitution): Admitting: Plastic Surgery

## 2024-08-20 ENCOUNTER — Ambulatory Visit: Admitting: Nurse Practitioner

## 2024-08-26 ENCOUNTER — Institutional Professional Consult (permissible substitution): Admitting: Plastic Surgery

## 2025-03-02 ENCOUNTER — Ambulatory Visit: Admitting: Internal Medicine
